# Patient Record
Sex: Female | Born: 1990 | Race: White | Hispanic: Yes | Marital: Single | Smoking: Never smoker
Health system: Southern US, Community
[De-identification: ages and names within clinical notes are randomized; demographics above are authoritative.]

## PROBLEM LIST (undated history)

## (undated) DIAGNOSIS — N939 Abnormal uterine and vaginal bleeding, unspecified: Secondary | ICD-10-CM

## (undated) DIAGNOSIS — G43E09 Chronic migraine with aura, not intractable, without status migrainosus: Secondary | ICD-10-CM

## (undated) HISTORY — PX: OTHER SURGICAL HISTORY: SHX169

## (undated) HISTORY — PX: LAPAROSCOPIC SALPINGO OOPHERECTOMY: SHX5927

---

## 2015-07-08 HISTORY — PX: LAPAROSCOPIC SALPINGO OOPHERECTOMY: SHX5927

## 2020-05-24 DIAGNOSIS — Z124 Encounter for screening for malignant neoplasm of cervix: Secondary | ICD-10-CM | POA: Diagnosis not present

## 2020-05-24 DIAGNOSIS — Z01419 Encounter for gynecological examination (general) (routine) without abnormal findings: Secondary | ICD-10-CM | POA: Diagnosis not present

## 2020-06-14 DIAGNOSIS — Z30430 Encounter for insertion of intrauterine contraceptive device: Secondary | ICD-10-CM | POA: Diagnosis not present

## 2020-06-14 DIAGNOSIS — Z3202 Encounter for pregnancy test, result negative: Secondary | ICD-10-CM | POA: Diagnosis not present

## 2020-08-17 ENCOUNTER — Other Ambulatory Visit: Payer: Self-pay | Admitting: Obstetrics

## 2020-08-17 DIAGNOSIS — N939 Abnormal uterine and vaginal bleeding, unspecified: Secondary | ICD-10-CM

## 2020-08-30 ENCOUNTER — Ambulatory Visit
Admission: RE | Admit: 2020-08-30 | Discharge: 2020-08-30 | Disposition: A | Discharge status: Home / Self Care | Payer: 59 | Source: Ambulatory Visit | Attending: Obstetrics | Admitting: Obstetrics

## 2020-08-30 ENCOUNTER — Other Ambulatory Visit: Payer: Self-pay

## 2020-08-30 DIAGNOSIS — N939 Abnormal uterine and vaginal bleeding, unspecified: Secondary | ICD-10-CM

## 2020-08-31 DIAGNOSIS — N939 Abnormal uterine and vaginal bleeding, unspecified: Secondary | ICD-10-CM | POA: Diagnosis not present

## 2021-07-23 DIAGNOSIS — L638 Other alopecia areata: Secondary | ICD-10-CM | POA: Diagnosis not present

## 2021-09-18 DIAGNOSIS — L638 Other alopecia areata: Secondary | ICD-10-CM | POA: Diagnosis not present

## 2021-10-07 DIAGNOSIS — Z01419 Encounter for gynecological examination (general) (routine) without abnormal findings: Secondary | ICD-10-CM | POA: Diagnosis not present

## 2021-10-10 LAB — COMPREHENSIVE METABOLIC PANEL
Albumin: 5 (ref 3.5–5.0)
Globulin: 3.2

## 2021-10-10 LAB — BASIC METABOLIC PANEL
BUN: 16 (ref 4–21)
Creatinine: 1.1 (ref 0.5–1.1)

## 2021-10-10 LAB — HEPATIC FUNCTION PANEL
ALT: 42 U/L — AB (ref 7–35)
AST: 28 (ref 13–35)
Alkaline Phosphatase: 75 (ref 25–125)
Bilirubin, Total: 0.4

## 2021-10-10 LAB — LIPID PANEL
Cholesterol: 192 (ref 0–200)
HDL: 56 (ref 35–70)
LDL Cholesterol: 113
Triglycerides: 112 (ref 40–160)

## 2021-10-10 LAB — HM HIV SCREENING LAB: HM HIV Screening: NEGATIVE

## 2021-10-10 LAB — HEMOGLOBIN A1C: Hemoglobin A1C: 5.7

## 2021-11-18 ENCOUNTER — Ambulatory Visit: Payer: 59 | Admitting: Medical-Surgical

## 2021-11-27 ENCOUNTER — Encounter: Payer: Self-pay | Admitting: Medical-Surgical

## 2021-11-27 ENCOUNTER — Ambulatory Visit: Payer: 59 | Admitting: Medical-Surgical

## 2021-11-27 VITALS — BP 134/86 | HR 98 | Resp 20 | Ht 64.0 in | Wt 261.6 lb

## 2021-11-27 DIAGNOSIS — Z7689 Persons encountering health services in other specified circumstances: Secondary | ICD-10-CM

## 2021-11-27 DIAGNOSIS — L639 Alopecia areata, unspecified: Secondary | ICD-10-CM | POA: Diagnosis not present

## 2021-11-27 DIAGNOSIS — G43109 Migraine with aura, not intractable, without status migrainosus: Secondary | ICD-10-CM | POA: Diagnosis not present

## 2021-11-27 DIAGNOSIS — L638 Other alopecia areata: Secondary | ICD-10-CM | POA: Diagnosis not present

## 2021-11-27 DIAGNOSIS — R7303 Prediabetes: Secondary | ICD-10-CM

## 2021-11-27 DIAGNOSIS — R635 Abnormal weight gain: Secondary | ICD-10-CM | POA: Insufficient documentation

## 2021-11-27 MED ORDER — NURTEC 75 MG PO TBDP: 75.0000 mg | ORAL_TABLET | Freq: Every day | ORAL | 1 refills | Status: DC | PRN

## 2021-11-27 MED ORDER — TOPIRAMATE 25 MG PO TABS
25.0000 mg | ORAL_TABLET | Freq: Two times a day (BID) | ORAL | 0 refills | Status: DC
  Filled 2022-01-01: qty 120, 60d supply, fill #0

## 2021-11-27 MED ORDER — SEMAGLUTIDE-WEIGHT MANAGEMENT 0.25 MG/0.5ML ~~LOC~~ SOAJ
0.2500 mg | SUBCUTANEOUS | 0 refills | Status: DC
  Filled 2022-01-01: qty 2, 28d supply, fill #0

## 2021-11-27 NOTE — Telephone Encounter (Signed)
Placed in basket to abstract.  

## 2021-11-27 NOTE — Assessment & Plan Note (Signed)
Discussed various options for weight loss treatment.  After review of medications, she would still like to proceed with GLP-1.  Sending in Wegovy 0.25 mg weekly x4 weeks to see if insurance will cover this.  We are starting Topamax which will likely help with appetite suppression and cravings as well.  Continue regular intentional exercise.  Discussed weighing and logging foods to get an accurate idea of her intake on a daily basis and help guide future dietary modifications.

## 2021-11-27 NOTE — Assessment & Plan Note (Signed)
Recommend continuing regular intentional exercise with a goal for weight loss.  Monitor dietary intake and try to reduce simple carbohydrates and concentrated sweets.  Starting SVXBLT.

## 2021-11-27 NOTE — Assessment & Plan Note (Signed)
Continue triamcinolone as directed by dermatology.

## 2021-11-27 NOTE — Assessment & Plan Note (Signed)
Start Topamax 25 mg daily for 1 week then increase to 50 mg daily.  Adding Nurtec 75 mg daily as needed as an abortive medication.

## 2021-11-27 NOTE — Progress Notes (Signed)
New Patient Office Visit  Subjective    Patient ID: Jamie Carey, female    DOB: 1991-07-03  Age: 31 y.o. MRN: [id number]  CC:  Chief Complaint  Patient presents with   Establish Care    HPI Jamie Carey presents to establish care.  Alopecia areata- managed by dermatology.  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 Treated with Sumitriptan and did fine for a while but has developed a severe allergy to triptans. Recently has become far more frequent and occurring a few times per week. Experiences photophobia, phonophobia, nausea, vomiting and generalized head pain. Auras before migraines starting several hours to minutes before and tends to be visual floaters and spots. Headaches last most of the day. Better with sleep. Has tried OTC medications however these were not helpful.   Weight concerns: eats a very healthy diet overall. Used to be very active and fit but doesn't do as much activity. Used to dance hours a day but now is walking around 3 miles per day. Does focused workouts virtually, some HIIT, some strength training.  Has been considering medications to help with weight loss and done some research into GLP-1's.  She is interested in an seeing if insurance will cover a medication to help.  She did have recent blood work including a hemoglobin A1c that was 5.7%.  She has been able to pull these up on the [facility] database and will screenshot them and send them as a message so we can abstract them into our system.  Outpatient Encounter Medications as of 11/27/2021  Medication Sig   ethynodiol-ethinyl estradiol (ZOVIA) 1-50 MG-MCG tablet Take 1 tablet by mouth daily.   Rimegepant Sulfate (NURTEC) 75 MG TBDP Take 75 mg by mouth daily as needed.   Semaglutide-Weight Management 0.25 MG/0.5ML SOAJ Inject 0.25 mg into the skin once a week.   topiramate (TOPAMAX) 25 MG tablet Take 1 tablet (25 mg total) by mouth 2 (two) times daily.   triamcinolone ointment  (KENALOG) 0.1 % Apply topically.   No facility-administered encounter medications on file as of 11/27/2021.    History reviewed. No pertinent past medical history.  Past Surgical History:  Procedure Laterality Date   saplingval oophrectomy      Family History  Problem Relation Age of Onset   Hypertension Mother    Liver cancer Father     Social History   Socioeconomic History   Marital status: Single    Spouse name: Not on file   Number of children: Not on file   Years of education: Not on file   Highest education level: Not on file  Occupational History   Not on file  Tobacco Use   Smoking status: Never   Smokeless tobacco: Never  Substance and Sexual Activity   Alcohol use: Never   Drug use: Never   Sexual activity: Never  Other Topics Concern   Not on file  Social History Narrative   Not on file   Social Determinants of Health   Financial Resource Strain: Not on file  Food Insecurity: Not on file  Transportation Needs: Not on file  Physical Activity: Not on file  Stress: Not on file  Social Connections: Not on file  Intimate Partner Violence: Not on file    Review of Systems  Constitutional:  Negative for chills, fever and malaise/fatigue.  Respiratory:  Negative for cough, shortness of breath and wheezing.   Cardiovascular:  Negative for chest pain, palpitations and leg swelling.  Neurological:  Negative for dizziness and headaches.  Psychiatric/Behavioral:  Negative for depression and suicidal ideas. The patient is not nervous/anxious and does not have insomnia.        Objective    BP 134/86 (BP Location: Right Arm, Cuff Size: Normal)   Pulse 98   Resp 20   Ht 5\' 4"  (1.626 m)   Wt 261 lb 9.6 oz (118.7 kg)   LMP 11/20/2021   SpO2 99%   BMI 44.90 kg/m   Physical Exam Vitals reviewed.  Constitutional:      General: She is not in acute distress.    Appearance: Normal appearance. She is obese. She is not ill-appearing.  HENT:     Head:  Normocephalic and atraumatic.  Cardiovascular:     Rate and Rhythm: Normal rate and regular rhythm.     Pulses: Normal pulses.     Heart sounds: Normal heart sounds. No murmur heard.   No friction rub. No gallop.  Pulmonary:     Effort: Pulmonary effort is normal. No respiratory distress.     Breath sounds: Normal breath sounds. No wheezing.  Skin:    General: Skin is warm and dry.  Neurological:     Mental Status: She is alert and oriented to person, place, and time.  Psychiatric:        Mood and Affect: Mood normal.        Behavior: Behavior normal.        Thought Content: Thought content normal.        Judgment: Judgment normal.        Assessment & Plan:   Problem List Items Addressed This Visit       Cardiovascular and Mediastinum   Migraine with aura and without status migrainosus, not intractable    Start Topamax 25 mg daily for 1 week then increase to 50 mg daily.  Adding Nurtec 75 mg daily as needed as an abortive medication.       Relevant Medications   topiramate (TOPAMAX) 25 MG tablet   Rimegepant Sulfate (NURTEC) 75 MG TBDP     Musculoskeletal and Integument   Alopecia areata    Continue triamcinolone as directed by dermatology.         Other   Morbid obesity (HCC)    Discussed various options for weight loss treatment.  After review of medications, she would still like to proceed with GLP-1.  Sending in Wegovy 0.25 mg weekly x4 weeks to see if insurance will cover this.  We are starting Topamax which will likely help with appetite suppression and cravings as well.  Continue regular intentional exercise.  Discussed weighing and logging foods to get an accurate idea of her intake on a daily basis and help guide future dietary modifications.       Relevant Medications   Semaglutide-Weight Management 0.25 MG/0.5ML SOAJ   Prediabetes    Recommend continuing regular intentional exercise with a goal for weight loss.  Monitor dietary intake and try to reduce  simple carbohydrates and concentrated sweets.  Starting 11/22/2021.       Relevant Medications   Semaglutide-Weight Management 0.25 MG/0.5ML SOAJ   Other Visit Diagnoses     Encounter to establish care    -  Primary       Return in about 6 weeks (around 01/08/2022) for migraine/weight gain follow up.   03/11/2022, NP

## 2021-11-29 ENCOUNTER — Telehealth: Payer: Self-pay | Admitting: Neurology

## 2021-11-29 ENCOUNTER — Telehealth: Payer: Self-pay

## 2021-11-29 NOTE — Telephone Encounter (Signed)
Initiated Prior authorization XBJ:YNWGNF 0.25MG/0.5ML auto-injectors Via: Covermymeds Case/Key:[id number] Status: approved as of 11/29/21 Reason:The authorization is effective for a maximum of 6 fills from 12/02/2021 to 06/21/2022, as long as the member is enrolled in their current health plan. The request was approved as submitted. This request has been approved for 56m per 28 days.Additional authorizations have been created for the following:Wegovy 0.538m0.5mL allowing 56m79mer 28 days; please reference authorization [id number].[id number] 1mg37m5mL allowing 56mL 56m 28 days; please reference authorization [id number] 1.7mg/070mmL allowing 3mL pe38m8 day; please reference authorization [id number] 2.4mg/0.746m allowing 3mL per 58mdays Notified Pt via: Mychart   Initiated Prior authorization for:NurteHQI:ONGEXBpersible tablets Via: Covermymeds Case/Key:[id number] Status: denied as of 11/29/21 Reason:uest has not been approved. Based on the information submitted for review, you did not meet our guideline rules for the requested drug. In order for your request to be approved, your provider would need to show that you have met the guideline rules below. The details below are written in medical language. If you have questions, please contact your provider. In some cases, the requested medication or alternatives offered may have additional approval requirements. Your provider requested Nurtec 75mg ODT 49mlly disintegrating tablets; medications that dissolve in your mouth) to manage your condition of episodic migraine (you experience 1-14 headache days per month). For the preventive treatment of episodic migraines, our guideline named RIMEGEPANT (the generic name for Nurtec 75mg ODT) 756mires that you will not use Nurtec 75mg ODT co28mrently (at the same time) with other CGRP inhibitors (such as Ajovy, Aimovig, Emgality, Vyepti, or Qulipta) for migraine prevention. This request was denied because we did not receive  information that you meet the requirement listed above. Notified Pt via: Mychart

## 2021-11-29 NOTE — Telephone Encounter (Signed)
Received note from [facility] that Jamie Carey is on back order. Called patient to see if she would like [place] to send to a different pharmacy. They told her they may get some on 12/06/2021. She wants to wait and see if they get this in, if not she will call with an alternative pharmacy.

## 2021-12-16 ENCOUNTER — Encounter: Payer: Self-pay | Admitting: Medical-Surgical

## 2021-12-16 DIAGNOSIS — G43109 Migraine with aura, not intractable, without status migrainosus: Secondary | ICD-10-CM

## 2021-12-18 ENCOUNTER — Telehealth: Payer: Self-pay

## 2021-12-18 NOTE — Telephone Encounter (Addendum)
Initiated Prior authorization for: Nurtec 75mg tab  Via: Covermymeds Case/Key:[id number]  Status: approved  as of 12/18/21 Reason: Notified Pt via: Mychart

## 2021-12-20 MED ORDER — NURTEC 75 MG PO TBDP
75.0000 mg | ORAL_TABLET | Freq: Every day | ORAL | 0 refills | Status: DC | PRN
  Filled 2022-02-18: qty 10, 30d supply, fill #0

## 2021-12-24 ENCOUNTER — Other Ambulatory Visit: Payer: Self-pay | Admitting: Medical-Surgical

## 2021-12-24 DIAGNOSIS — G43109 Migraine with aura, not intractable, without status migrainosus: Secondary | ICD-10-CM

## 2022-01-01 ENCOUNTER — Other Ambulatory Visit (HOSPITAL_COMMUNITY): Payer: Self-pay

## 2022-01-01 MED ORDER — CLOBETASOL PROPIONATE 0.05 % EX SOLN: CUTANEOUS | 1 refills | Status: DC

## 2022-01-08 ENCOUNTER — Other Ambulatory Visit (HOSPITAL_COMMUNITY): Payer: Self-pay

## 2022-01-10 ENCOUNTER — Ambulatory Visit: Payer: 59 | Admitting: Medical-Surgical

## 2022-02-04 ENCOUNTER — Encounter: Payer: Self-pay | Admitting: Medical-Surgical

## 2022-02-17 NOTE — Progress Notes (Unsigned)
   Established Patient Office Visit  Subjective   Patient ID: Jamie Carey, female   DOB: Jun 11, 1991 Age: 31 y.o. MRN: [id number]   No chief complaint on file.   HPI Pleasant 31 year old female presenting today for the following:  Weight gain:  Prediabetes:  Migraines:   Objective:    There were no vitals filed for this visit.  Physical Exam   No results found for this or any previous visit (from the past 24 hour(s)).   {Labs (Optional):23779}  The ASCVD Risk score (Arnett DK, et al., 2019) failed to calculate for the following reasons:   The 2019 ASCVD risk score is only valid for ages 42 to 28   Assessment & Plan:   No problem-specific Assessment & Plan notes found for this encounter.   No follow-ups on file.  ___________________________________________ Thayer Ohm, DNP, APRN, FNP-BC Primary Care and Sports Medicine [facility] [place]

## 2022-02-18 ENCOUNTER — Ambulatory Visit: Payer: 59 | Admitting: Medical-Surgical

## 2022-02-18 ENCOUNTER — Other Ambulatory Visit (HOSPITAL_COMMUNITY): Payer: Self-pay

## 2022-02-18 ENCOUNTER — Encounter: Payer: Self-pay | Admitting: Medical-Surgical

## 2022-02-18 VITALS — BP 135/85 | HR 89 | Resp 20 | Ht 64.0 in | Wt 256.4 lb

## 2022-02-18 DIAGNOSIS — R635 Abnormal weight gain: Secondary | ICD-10-CM | POA: Diagnosis not present

## 2022-02-18 DIAGNOSIS — G43109 Migraine with aura, not intractable, without status migrainosus: Secondary | ICD-10-CM | POA: Diagnosis not present

## 2022-02-18 DIAGNOSIS — R7303 Prediabetes: Secondary | ICD-10-CM

## 2022-02-18 MED ORDER — SEMAGLUTIDE-WEIGHT MANAGEMENT 1.7 MG/0.75ML ~~LOC~~ SOAJ
1.7000 mg | SUBCUTANEOUS | 3 refills | Status: DC
  Filled 2022-02-18: qty 3, fill #0
  Filled 2022-04-20: qty 3, 28d supply, fill #0
  Filled 2022-05-24: qty 3, 28d supply, fill #1

## 2022-02-18 MED ORDER — TOPIRAMATE 50 MG PO TABS
50.0000 mg | ORAL_TABLET | Freq: Two times a day (BID) | ORAL | 1 refills | Status: DC
  Filled 2022-02-18: qty 180, 90d supply, fill #0
  Filled 2022-05-24: qty 180, 90d supply, fill #1

## 2022-02-18 MED ORDER — SEMAGLUTIDE-WEIGHT MANAGEMENT 0.5 MG/0.5ML ~~LOC~~ SOAJ
0.5000 mg | SUBCUTANEOUS | 0 refills | Status: AC
  Filled 2022-02-18: qty 2, 28d supply, fill #0

## 2022-02-18 MED ORDER — SEMAGLUTIDE-WEIGHT MANAGEMENT 1 MG/0.5ML ~~LOC~~ SOAJ
1.0000 mg | SUBCUTANEOUS | 0 refills | Status: AC
  Filled 2022-02-18 – 2022-03-18 (×2): qty 2, 28d supply, fill #0

## 2022-02-19 ENCOUNTER — Other Ambulatory Visit (HOSPITAL_COMMUNITY): Payer: Self-pay

## 2022-02-27 ENCOUNTER — Other Ambulatory Visit (HOSPITAL_COMMUNITY): Payer: Self-pay

## 2022-03-18 ENCOUNTER — Other Ambulatory Visit (HOSPITAL_COMMUNITY): Payer: Self-pay

## 2022-03-19 ENCOUNTER — Other Ambulatory Visit (HOSPITAL_COMMUNITY): Payer: Self-pay

## 2022-03-31 ENCOUNTER — Other Ambulatory Visit: Payer: Self-pay | Admitting: Medical-Surgical

## 2022-03-31 ENCOUNTER — Other Ambulatory Visit (HOSPITAL_COMMUNITY): Payer: Self-pay

## 2022-03-31 DIAGNOSIS — G43109 Migraine with aura, not intractable, without status migrainosus: Secondary | ICD-10-CM

## 2022-03-31 MED ORDER — NURTEC 75 MG PO TBDP
75.0000 mg | ORAL_TABLET | Freq: Every day | ORAL | 0 refills | Status: DC | PRN
  Filled 2022-03-31: qty 10, 30d supply, fill #0

## 2022-03-31 NOTE — Telephone Encounter (Signed)
Last office visit 02/18/2022  Last filled 12/24/2021

## 2022-04-01 ENCOUNTER — Other Ambulatory Visit (HOSPITAL_COMMUNITY): Payer: Self-pay

## 2022-04-21 ENCOUNTER — Other Ambulatory Visit (HOSPITAL_COMMUNITY): Payer: Self-pay

## 2022-04-22 ENCOUNTER — Other Ambulatory Visit (HOSPITAL_COMMUNITY): Payer: Self-pay

## 2022-04-28 ENCOUNTER — Other Ambulatory Visit (HOSPITAL_COMMUNITY): Payer: Self-pay

## 2022-05-01 ENCOUNTER — Other Ambulatory Visit (HOSPITAL_COMMUNITY): Payer: Self-pay

## 2022-05-21 ENCOUNTER — Ambulatory Visit: Payer: 59 | Admitting: Medical-Surgical

## 2022-05-24 ENCOUNTER — Other Ambulatory Visit (HOSPITAL_COMMUNITY): Payer: Self-pay

## 2022-05-26 ENCOUNTER — Other Ambulatory Visit (HOSPITAL_COMMUNITY): Payer: Self-pay

## 2022-06-12 ENCOUNTER — Ambulatory Visit: Payer: 59 | Admitting: Medical-Surgical

## 2022-06-12 ENCOUNTER — Other Ambulatory Visit (HOSPITAL_COMMUNITY): Payer: Self-pay

## 2022-06-12 ENCOUNTER — Telehealth: Payer: Self-pay

## 2022-06-12 ENCOUNTER — Encounter: Payer: Self-pay | Admitting: Medical-Surgical

## 2022-06-12 VITALS — BP 126/83 | HR 81 | Resp 20 | Ht 64.0 in | Wt 231.5 lb

## 2022-06-12 DIAGNOSIS — R635 Abnormal weight gain: Secondary | ICD-10-CM

## 2022-06-12 MED ORDER — ONDANSETRON 4 MG PO TBDP
4.0000 mg | ORAL_TABLET | Freq: Three times a day (TID) | ORAL | 3 refills | Status: DC | PRN
  Filled 2022-06-12: qty 20, 7d supply, fill #0
  Filled 2022-08-25: qty 20, 7d supply, fill #1

## 2022-06-12 MED ORDER — SEMAGLUTIDE-WEIGHT MANAGEMENT 1.7 MG/0.75ML ~~LOC~~ SOAJ
1.7000 mg | SUBCUTANEOUS | 1 refills | Status: DC
  Filled 2022-06-12 – 2022-07-03 (×3): qty 3, 28d supply, fill #0

## 2022-06-12 NOTE — Telephone Encounter (Signed)
Initiated Prior authorization YYF:RTMYTR 75MG  dispersible tablets Via: Covermymeds Case/Key:[id number]  Status: N/A as of 06/12/22 Reason:pt is not up for a renewal on this medication Member has an active PA on file which is expiring on 06/18/2022 and has 3 no. of fills remaining. Notified Pt via: Mychart

## 2022-06-12 NOTE — Progress Notes (Signed)
Established Patient Office Visit  Subjective   Patient ID: Jamie Carey, female   DOB: [birth year] Age: 31 y.o. MRN: [id number]   Chief Complaint  Patient presents with   Weight Check    HPI Pleasant 31 year old female presenting today for weight check on Wegovy.  She has been doing very well on Wegovy and is currently at the 1.7 mg dose.  She has been at this weekly dose for about 7 weeks.  Initially started out with some constipation but this has fully resolved and she has no other side effects or concerns.  Exercising regularly and admits that she does a lot of different things but tries to walk every day.  Has also incorporated strength training into the mix.  Making healthy choices and aiming for small portions.  Admits that she usually eats like a bird but she has tried to make a conscious effort to eat 3 meals per day.  Prefers not to snack.  Migraines: Taking Topamax 50 mg twice daily as prescribed.  Tolerating well without side effects.  Using Nurtec 75 mg daily as needed for breakthrough migraines.  Estimates that she previously had 3-4 migraines per week and this has reduced to 3 to 4/month.  She is very happy with this reduction and feels that the medication is at a good dose for her.   Objective:    Vitals:   06/12/22 1012  BP: 126/83  Pulse: 81  Resp: 20  Height: 5\' 4"  (1.626 m)  Weight: 231 lb 8 oz (105 kg)  SpO2: 98%  BMI (Calculated): 39.72    Physical Exam Vitals and nursing note reviewed.  Constitutional:      General: She is not in acute distress.    Appearance: Normal appearance. She is not ill-appearing.  HENT:     Head: Normocephalic and atraumatic.  Cardiovascular:     Rate and Rhythm: Normal rate and regular rhythm.  Pulmonary:     Effort: Pulmonary effort is normal. No respiratory distress.  Skin:    General: Skin is warm and dry.  Neurological:     Mental Status: She is alert and oriented to person, place, and time.  Psychiatric:         Mood and Affect: Mood normal.        Behavior: Behavior normal.        Thought Content: Thought content normal.        Judgment: Judgment normal.   No results found for this or any previous visit (from the past 24 hour(s)).     The ASCVD Risk score (Arnett DK, et al., 2019) failed to calculate for the following reasons:   The 2019 ASCVD risk score is only valid for ages 43 to 6   Assessment & Plan:   1. Weight gain Since the middle of August, she has lost 25 pounds and is doing extremely well with diet and lifestyle modifications.  Plan to continue Wegovy at 1.7 mg weekly for now since she is still having good weight loss results.  This gives [place] a place to go in the case of a plateau.  She does not currently have a goal set for her preferred weight but will think on this.  Continue regular intentional exercise and dietary changes.  2.  Migraines Continue Topamax 50 mg twice daily and Nurtec 75 mg as needed for breakthrough migraines.  Adding Zofran 4 mg every 8 hours as needed for nausea or vomiting for breakthrough migraines.  Return in  about 6 months (around 12/12/2022) for weight/migraine follow up.  ___________________________________________ Thayer Ohm, DNP, APRN, FNP-BC Primary Care and Sports Medicine [facility] [place]

## 2022-06-21 ENCOUNTER — Other Ambulatory Visit: Payer: Self-pay | Admitting: Medical-Surgical

## 2022-06-21 ENCOUNTER — Other Ambulatory Visit (HOSPITAL_COMMUNITY): Payer: Self-pay

## 2022-06-21 DIAGNOSIS — G43109 Migraine with aura, not intractable, without status migrainosus: Secondary | ICD-10-CM

## 2022-06-23 ENCOUNTER — Other Ambulatory Visit (HOSPITAL_COMMUNITY): Payer: Self-pay

## 2022-06-23 MED ORDER — NURTEC 75 MG PO TBDP
75.0000 mg | ORAL_TABLET | Freq: Every day | ORAL | 0 refills | Status: DC | PRN
  Filled 2022-06-23: qty 8, 30d supply, fill #0
  Filled 2022-07-22: qty 10, 30d supply, fill #0

## 2022-06-26 ENCOUNTER — Other Ambulatory Visit (HOSPITAL_COMMUNITY): Payer: Self-pay

## 2022-06-27 ENCOUNTER — Other Ambulatory Visit (HOSPITAL_COMMUNITY): Payer: Self-pay

## 2022-07-01 ENCOUNTER — Other Ambulatory Visit (HOSPITAL_COMMUNITY): Payer: Self-pay

## 2022-07-03 ENCOUNTER — Encounter: Payer: Self-pay | Admitting: Medical-Surgical

## 2022-07-03 ENCOUNTER — Other Ambulatory Visit (HOSPITAL_COMMUNITY): Payer: Self-pay

## 2022-07-04 ENCOUNTER — Other Ambulatory Visit (HOSPITAL_COMMUNITY): Payer: Self-pay

## 2022-07-04 MED ORDER — WEGOVY 2.4 MG/0.75ML ~~LOC~~ SOAJ
2.4000 mg | SUBCUTANEOUS | 11 refills | Status: DC
  Filled 2022-07-04 – 2022-08-02 (×3): qty 3, 28d supply, fill #0
  Filled 2022-08-25: qty 3, 28d supply, fill #1
  Filled 2022-09-22 – 2022-09-24 (×2): qty 3, 28d supply, fill #2

## 2022-07-10 ENCOUNTER — Telehealth: Payer: Self-pay

## 2022-07-10 NOTE — Telephone Encounter (Addendum)
Initiated Prior authorization IEP:PIRJJO 2.4MG /0.75ML auto-injectors Via: Covermymeds Case/Key:[id number] Status: approved  as of 07/10/21 Reason:The request has been approved. The authorization is effective from 07/14/2022 to 07/14/2023, as long as the member is enrolled in their current health plan. The request was approved as submitted. This request has been approved for 42mL per 28 days.A second prior authorization [id number]) has been entered for [facility] 0.25mg /0.81mL, this request is effective from 07/14/2022 and is valid until 07/14/2023 and is limited to 21mL per 28 days.A third prior authorization [id number]) has been entered for [facility] 0.5mg /0.67mL, this request is effective from 07/14/2022 and is valid until 07/14/2023 and is limited to 52mL per 28 days.A fourth prior authorization [id number]) has been entered for [facility] 1mg /0.56mL, this request is effective from 07/14/2022 and is valid until 07/14/2023 and is limited to 81mL per 28 days.A fifth prior authorization [id number]) has been entered for [facility] 1.7mg /0.49mL, this request is effective from 07/14/2022 and is valid until 07/14/2023 and is limited to 38mL per 28 days.Please note: This medication must be filled at and/or managed by a [place]. Please call [phone] for assistance. A written notification letter will follow with additional details. Notified Pt via: Mychart

## 2022-07-22 ENCOUNTER — Other Ambulatory Visit (HOSPITAL_COMMUNITY): Payer: Self-pay

## 2022-07-22 NOTE — Telephone Encounter (Signed)
Initiated Prior authorization ANV:BTYOMA 75MG  dispersible tablets  Via: Covermymeds Case/Key:[id number] Status: approved  as of 07/22/21 Reason:The authorization is effective from 07/22/2022 to 01/19/2023 Notified Pt via: Mychart

## 2022-07-23 ENCOUNTER — Other Ambulatory Visit (HOSPITAL_COMMUNITY): Payer: Self-pay

## 2022-07-24 ENCOUNTER — Other Ambulatory Visit (HOSPITAL_COMMUNITY): Payer: Self-pay

## 2022-07-28 ENCOUNTER — Other Ambulatory Visit (HOSPITAL_COMMUNITY): Payer: Self-pay

## 2022-08-02 ENCOUNTER — Other Ambulatory Visit (HOSPITAL_COMMUNITY): Payer: Self-pay

## 2022-08-25 ENCOUNTER — Other Ambulatory Visit: Payer: Self-pay | Admitting: Medical-Surgical

## 2022-08-25 ENCOUNTER — Other Ambulatory Visit (HOSPITAL_COMMUNITY): Payer: Self-pay

## 2022-08-25 ENCOUNTER — Other Ambulatory Visit: Payer: Self-pay

## 2022-08-25 DIAGNOSIS — G43109 Migraine with aura, not intractable, without status migrainosus: Secondary | ICD-10-CM

## 2022-08-25 MED ORDER — TOPIRAMATE 50 MG PO TABS
50.0000 mg | ORAL_TABLET | Freq: Two times a day (BID) | ORAL | 1 refills | Status: DC
  Filled 2022-08-25 – 2022-12-22 (×4): qty 180, 90d supply, fill #0
  Filled 2023-02-07 – 2023-05-24 (×2): qty 180, 90d supply, fill #1

## 2022-09-04 ENCOUNTER — Other Ambulatory Visit (HOSPITAL_COMMUNITY): Payer: Self-pay

## 2022-09-23 ENCOUNTER — Other Ambulatory Visit: Payer: Self-pay

## 2022-09-23 ENCOUNTER — Other Ambulatory Visit (HOSPITAL_COMMUNITY): Payer: Self-pay

## 2022-09-24 ENCOUNTER — Other Ambulatory Visit (HOSPITAL_COMMUNITY): Payer: Self-pay

## 2022-09-24 ENCOUNTER — Other Ambulatory Visit: Payer: Self-pay

## 2022-10-03 ENCOUNTER — Other Ambulatory Visit (HOSPITAL_COMMUNITY): Payer: Self-pay

## 2022-11-10 ENCOUNTER — Ambulatory Visit: Payer: 59 | Admitting: Medical-Surgical

## 2022-12-11 ENCOUNTER — Ambulatory Visit: Payer: 59 | Admitting: Medical-Surgical

## 2022-12-11 ENCOUNTER — Encounter: Payer: Self-pay | Admitting: Medical-Surgical

## 2022-12-11 ENCOUNTER — Other Ambulatory Visit: Payer: Self-pay

## 2022-12-11 ENCOUNTER — Other Ambulatory Visit (HOSPITAL_COMMUNITY): Payer: Self-pay

## 2022-12-11 VITALS — BP 120/82 | HR 92 | Resp 20 | Ht 64.0 in | Wt 205.2 lb

## 2022-12-11 DIAGNOSIS — R635 Abnormal weight gain: Secondary | ICD-10-CM | POA: Diagnosis not present

## 2022-12-11 DIAGNOSIS — G43109 Migraine with aura, not intractable, without status migrainosus: Secondary | ICD-10-CM

## 2022-12-11 MED ORDER — NURTEC 75 MG PO TBDP
75.0000 mg | ORAL_TABLET | Freq: Every day | ORAL | 3 refills | Status: DC | PRN
  Filled 2022-12-11: qty 16, 30d supply, fill #0
  Filled 2023-02-07 – 2023-04-20 (×2): qty 10, 30d supply, fill #0
  Filled 2023-05-24: qty 10, 30d supply, fill #1
  Filled 2023-06-21: qty 10, 30d supply, fill #2
  Filled 2023-09-21: qty 10, 30d supply, fill #3

## 2022-12-11 MED ORDER — ONDANSETRON 4 MG PO TBDP
4.0000 mg | ORAL_TABLET | Freq: Three times a day (TID) | ORAL | 3 refills | Status: DC | PRN
  Filled 2022-12-11 – 2022-12-22 (×2): qty 20, 7d supply, fill #0
  Filled 2023-02-07: qty 20, 7d supply, fill #1
  Filled 2023-05-24: qty 20, 7d supply, fill #2
  Filled 2023-06-21: qty 20, 7d supply, fill #3

## 2022-12-11 NOTE — Progress Notes (Signed)
        Established patient visit  History, exam, impression, and plan:  1. Migraine with aura and without status migrainosus, not intractable Pleasant 32 year old female presenting today with a history of migraines.  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  She had been taking 50 mg twice daily however she did cut back to taking it once daily instead.  Has started having a few more breakthrough headaches and plans to increase her dosing to twice daily again.  Has had to use Nurtec a couple of times and reports that it works very well to help manage her symptoms.  Has Zofran to use as needed due to associated nausea.  Has no significant side effects to the medication.  Denies any changes to the quality and characteristics of her migraines.  Plan to increase Topamax to 50 mg twice daily.  Refill Nurtec for as needed use.  Refilling Zofran. - Rimegepant Sulfate (NURTEC) 75 MG TBDP; Dissolve 1 tablet on the tongue daily as needed  Dispense: 10 tablet; Refill: 3  2. Weight gain Has had significant concerns about her weight and has been making efforts for weight loss.  We started Topamax for migraines however we have also been using this to help with weight loss.  Has noticed some changes to her taste, specifically around sodas.  In the last year has lost approximately 55 pounds and is very happy with her results so far.  Has resumed exercise and is doing cardio and strength training 5 to 7 days/week.  As she has made excellent progress so far, plan to continue dietary modification and exercise.  Increasing Topamax to 50 mg twice daily as noted above which may continue to help with further weight loss.   Procedures performed this visit: None.  Return for annual physical exam at your convenience.  __________________________________ Thayer Ohm, DNP, APRN, FNP-BC Primary Care and Sports Medicine [facility] [place]

## 2022-12-13 ENCOUNTER — Other Ambulatory Visit (HOSPITAL_COMMUNITY): Payer: Self-pay

## 2022-12-19 ENCOUNTER — Other Ambulatory Visit (HOSPITAL_COMMUNITY): Payer: Self-pay

## 2022-12-22 ENCOUNTER — Other Ambulatory Visit: Payer: Self-pay

## 2022-12-23 ENCOUNTER — Other Ambulatory Visit (HOSPITAL_COMMUNITY): Payer: Self-pay

## 2022-12-24 ENCOUNTER — Other Ambulatory Visit (HOSPITAL_COMMUNITY): Payer: Self-pay

## 2023-01-05 ENCOUNTER — Other Ambulatory Visit (HOSPITAL_COMMUNITY): Payer: Self-pay

## 2023-01-19 ENCOUNTER — Other Ambulatory Visit (HOSPITAL_COMMUNITY): Payer: Self-pay

## 2023-01-28 IMAGING — US US PELVIS COMPLETE WITH TRANSVAGINAL
1 series · 13 of 25 positions shown · non-contrast
Comparison: None

CLINICAL DATA: Abnormal uterine and vaginal bleeding motor an IUD

EXAM:
TRANSABDOMINAL AND TRANSVAGINAL ULTRASOUND OF PELVIS
TECHNIQUE: Both transabdominal and transvaginal ultrasound examinations of the
pelvis were performed. Transabdominal technique was performed for
global imaging of the pelvis including uterus, ovaries, adnexal
regions, and pelvic cul-de-sac. It was necessary to proceed with
endovaginal exam following the transabdominal exam to visualize the
uterus endometrium ovaries.

[Series 1: us pelvis complete with transvaginal · 0.26mm/px · 52 acquisitions, 13 frames shown]
[im 1/52]
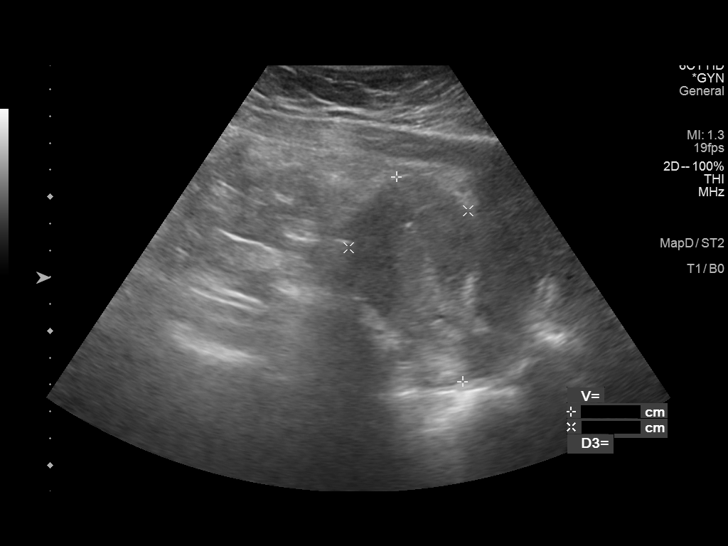
[im 5/52]
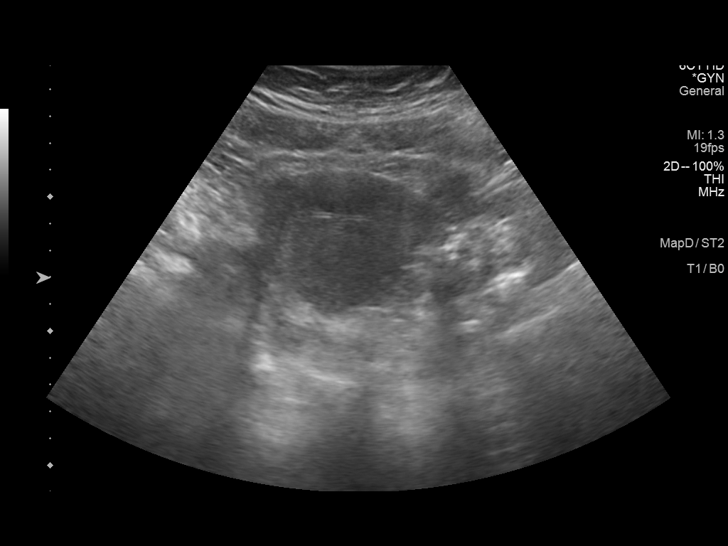
[im 9/52]
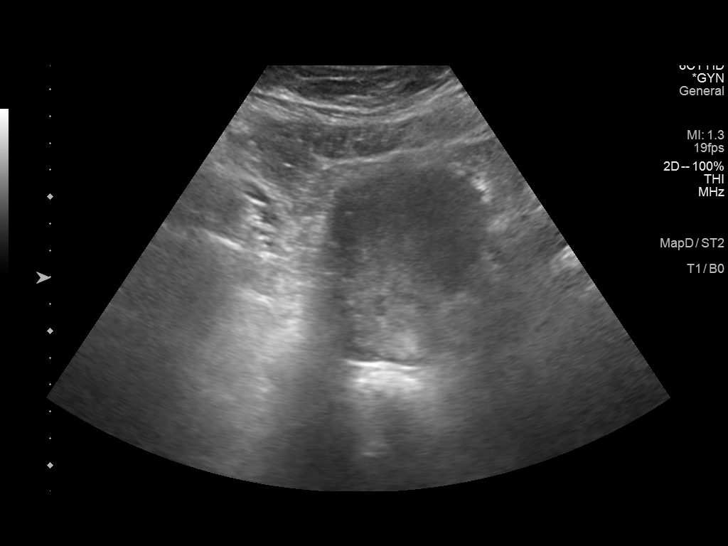
[im 13/52]
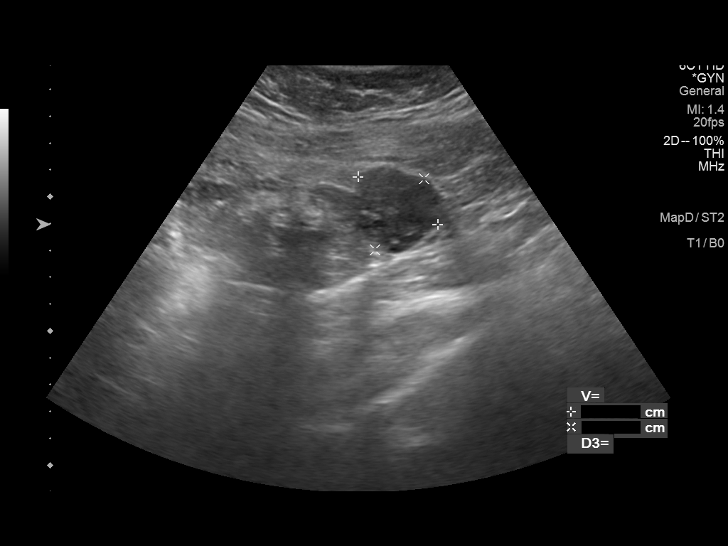
[im 18/52]
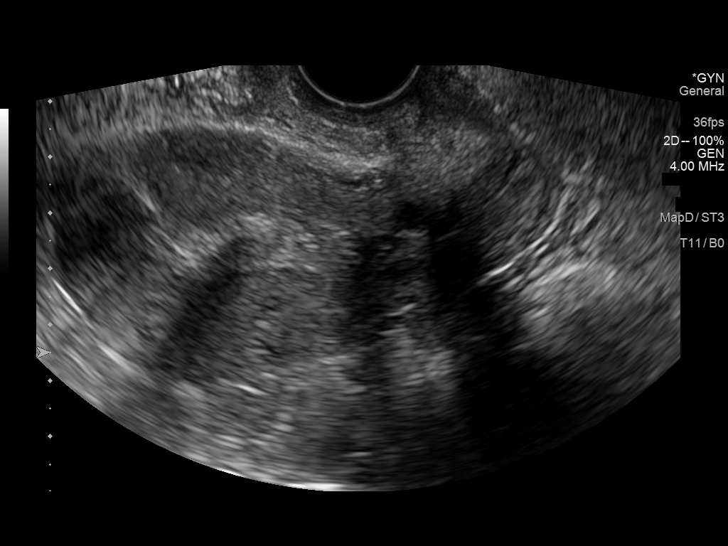
[im 22/52]
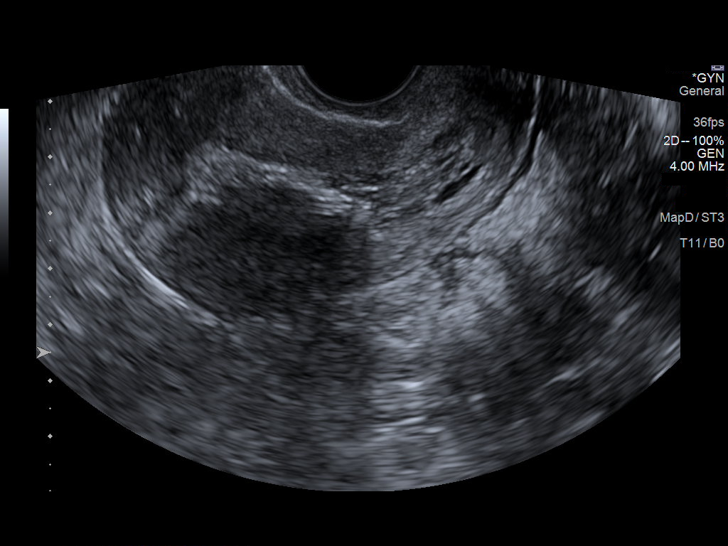
[im 26/52]
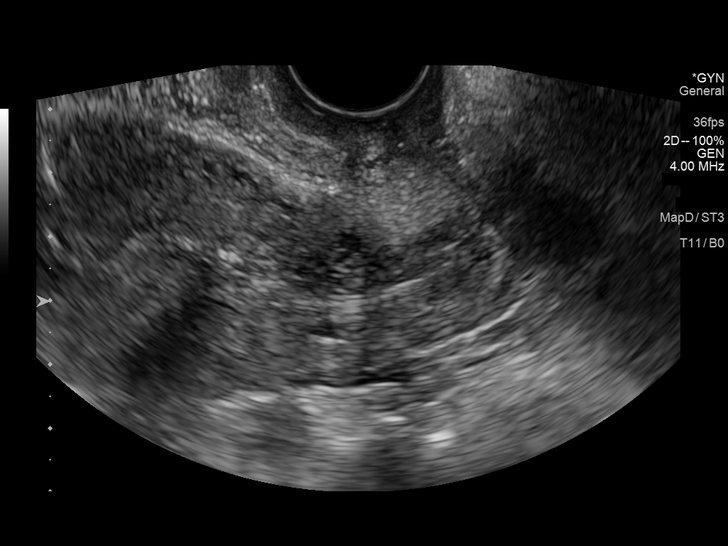
[im 30/52]
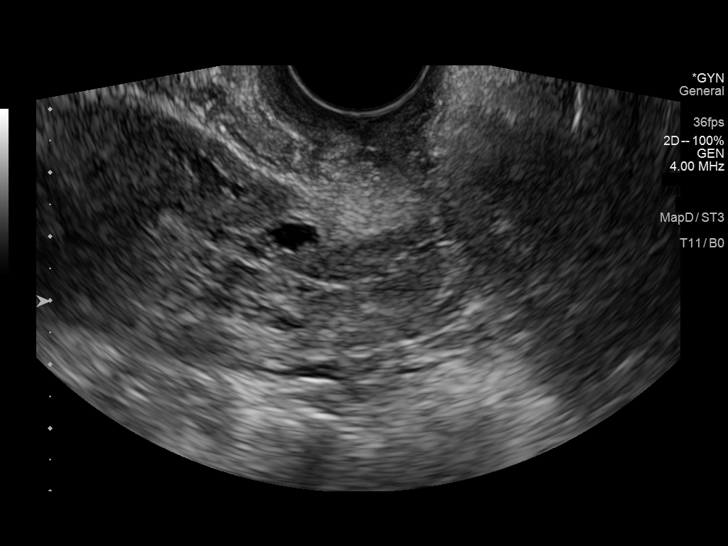
[im 35/52]
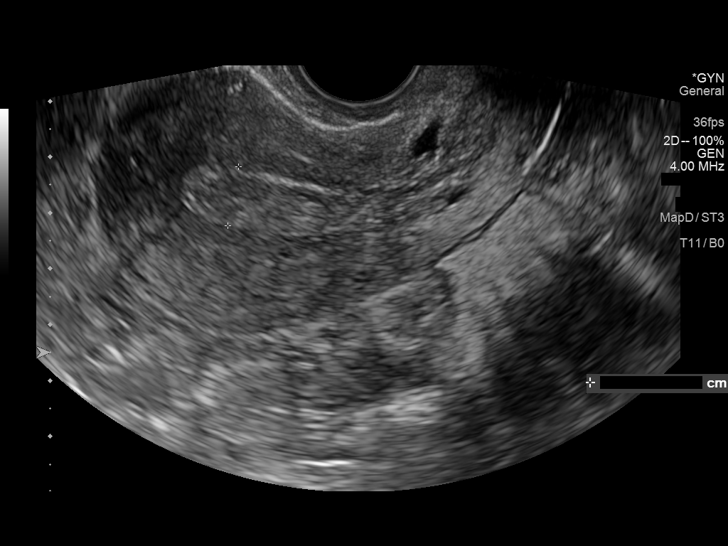
[im 39/52]
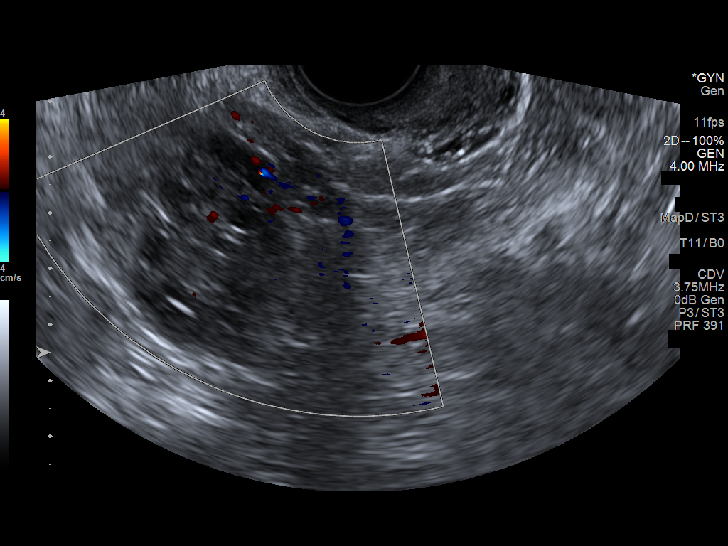
[im 43/52]
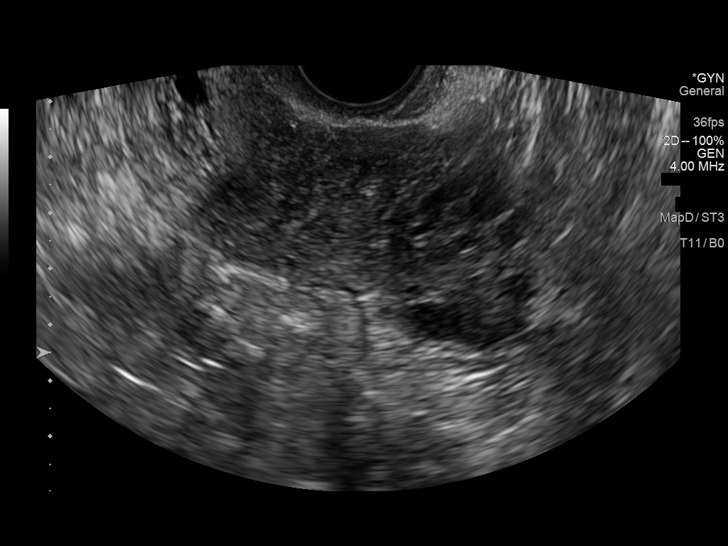
[im 47/52]
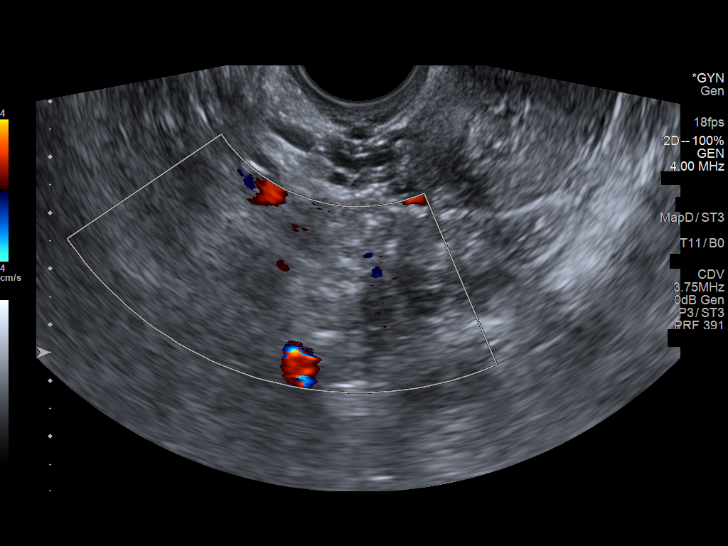
[im 52/52]
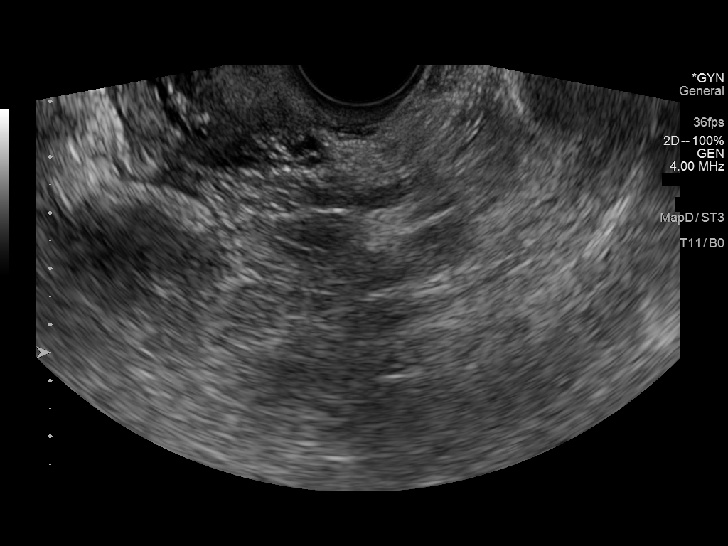

[13 of 25 positions shown; findings below may reference images not displayed]

FINDINGS: Uterus

Measurements: 8 x 4.7 x 5.8 cm = volume: 113.7 mL. Multiple
nabothian cysts in the cervix.

Endometrium

Thickness: 10.7 mm.  IUD within the uterus

Right ovary

Not seen, reported surgically absent

Left ovary

Measurements: 4.2 x 3.3 x 2.7 cm = volume: 19.2 mL. Numerous
follicles. No dominant mass.

Other findings

No abnormal free fluid.
IMPRESSION: 1. IUD within the uterus, appears grossly appropriate in position by
sonography. Normal premenopausal endometrial thickness
2. Nonvisualized right ovary consistent with history of prior
removal.
3. Mildly prominent left ovary with numerous follicles, question
polycystic ovary.

## 2023-01-29 ENCOUNTER — Other Ambulatory Visit (HOSPITAL_COMMUNITY): Payer: Self-pay

## 2023-02-09 ENCOUNTER — Other Ambulatory Visit (HOSPITAL_COMMUNITY): Payer: Self-pay

## 2023-02-09 ENCOUNTER — Other Ambulatory Visit: Payer: Self-pay

## 2023-02-09 DIAGNOSIS — N939 Abnormal uterine and vaginal bleeding, unspecified: Secondary | ICD-10-CM | POA: Diagnosis not present

## 2023-02-09 DIAGNOSIS — Z113 Encounter for screening for infections with a predominantly sexual mode of transmission: Secondary | ICD-10-CM | POA: Diagnosis not present

## 2023-02-09 DIAGNOSIS — Z124 Encounter for screening for malignant neoplasm of cervix: Secondary | ICD-10-CM | POA: Diagnosis not present

## 2023-02-09 DIAGNOSIS — Z01419 Encounter for gynecological examination (general) (routine) without abnormal findings: Secondary | ICD-10-CM | POA: Diagnosis not present

## 2023-02-12 ENCOUNTER — Encounter: Payer: Self-pay | Admitting: Medical-Surgical

## 2023-03-25 ENCOUNTER — Other Ambulatory Visit (HOSPITAL_COMMUNITY): Payer: Self-pay

## 2023-03-25 MED ORDER — NORETHINDRONE ACETATE 5 MG PO TABS
15.0000 mg | ORAL_TABLET | Freq: Every day | ORAL | 3 refills | Status: DC
  Filled 2023-03-25: qty 90, 30d supply, fill #0
  Filled 2023-04-20: qty 90, 30d supply, fill #1
  Filled 2023-05-24: qty 90, 30d supply, fill #2
  Filled 2023-06-21: qty 90, 30d supply, fill #3

## 2023-03-26 ENCOUNTER — Other Ambulatory Visit (HOSPITAL_COMMUNITY): Payer: Self-pay

## 2023-04-16 ENCOUNTER — Encounter: Payer: Self-pay | Admitting: Medical-Surgical

## 2023-04-17 ENCOUNTER — Telehealth: Payer: Self-pay

## 2023-04-17 NOTE — Telephone Encounter (Addendum)
Initiated Prior authorization ZOX:WRUEAV 75MG  dispersible tablets Via: Covermymeds Case/Key:[id number] Status: approved  as of 04/17/23 Reason:Authorization Expiration Date: 04/15/2024  Notified Pt via: Mychart

## 2023-04-20 ENCOUNTER — Other Ambulatory Visit (HOSPITAL_COMMUNITY): Payer: Self-pay

## 2023-04-22 ENCOUNTER — Other Ambulatory Visit (HOSPITAL_COMMUNITY): Payer: Self-pay

## 2023-04-29 DIAGNOSIS — M25572 Pain in left ankle and joints of left foot: Secondary | ICD-10-CM | POA: Diagnosis not present

## 2023-05-13 DIAGNOSIS — M25572 Pain in left ankle and joints of left foot: Secondary | ICD-10-CM | POA: Diagnosis not present

## 2023-06-02 DIAGNOSIS — M25572 Pain in left ankle and joints of left foot: Secondary | ICD-10-CM | POA: Diagnosis not present

## 2023-06-21 ENCOUNTER — Other Ambulatory Visit: Payer: Self-pay | Admitting: Medical-Surgical

## 2023-06-21 DIAGNOSIS — G43109 Migraine with aura, not intractable, without status migrainosus: Secondary | ICD-10-CM

## 2023-06-22 ENCOUNTER — Other Ambulatory Visit (HOSPITAL_COMMUNITY): Payer: Self-pay

## 2023-06-22 MED ORDER — TOPIRAMATE 50 MG PO TABS
50.0000 mg | ORAL_TABLET | Freq: Two times a day (BID) | ORAL | 0 refills | Status: DC
  Filled 2023-06-22 – 2023-09-21 (×2): qty 180, 90d supply, fill #0

## 2023-06-23 ENCOUNTER — Other Ambulatory Visit: Payer: Self-pay

## 2023-07-27 ENCOUNTER — Other Ambulatory Visit (HOSPITAL_COMMUNITY): Payer: Self-pay

## 2023-07-29 ENCOUNTER — Other Ambulatory Visit (HOSPITAL_COMMUNITY): Payer: Self-pay

## 2023-07-29 MED ORDER — NORETHINDRONE ACETATE 5 MG PO TABS
15.0000 mg | ORAL_TABLET | Freq: Every day | ORAL | 0 refills | Status: DC
  Filled 2023-07-29: qty 90, 30d supply, fill #0

## 2023-07-30 ENCOUNTER — Other Ambulatory Visit (HOSPITAL_COMMUNITY): Payer: Self-pay

## 2023-08-03 ENCOUNTER — Other Ambulatory Visit (HOSPITAL_COMMUNITY): Payer: Self-pay

## 2023-08-06 DIAGNOSIS — N939 Abnormal uterine and vaginal bleeding, unspecified: Secondary | ICD-10-CM | POA: Diagnosis not present

## 2023-08-22 ENCOUNTER — Other Ambulatory Visit (HOSPITAL_COMMUNITY): Payer: Self-pay

## 2023-08-24 ENCOUNTER — Other Ambulatory Visit (HOSPITAL_COMMUNITY): Payer: Self-pay

## 2023-08-24 MED ORDER — NORETHINDRONE ACETATE 5 MG PO TABS
15.0000 mg | ORAL_TABLET | Freq: Every day | ORAL | 0 refills | Status: DC
Start: 2023-08-24 — End: 2023-09-22
  Filled 2023-08-24: qty 90, 30d supply, fill #0

## 2023-09-21 ENCOUNTER — Other Ambulatory Visit: Payer: Self-pay | Admitting: Medical-Surgical

## 2023-09-21 ENCOUNTER — Other Ambulatory Visit: Payer: Self-pay

## 2023-09-21 ENCOUNTER — Other Ambulatory Visit (HOSPITAL_COMMUNITY): Payer: Self-pay

## 2023-09-22 ENCOUNTER — Other Ambulatory Visit (HOSPITAL_COMMUNITY): Payer: Self-pay

## 2023-09-22 ENCOUNTER — Other Ambulatory Visit: Payer: Self-pay

## 2023-09-22 MED ORDER — NORETHINDRONE ACETATE 5 MG PO TABS
15.0000 mg | ORAL_TABLET | Freq: Every day | ORAL | 2 refills | Status: DC
  Filled 2023-09-22: qty 90, 30d supply, fill #0
  Filled 2023-10-23: qty 90, 30d supply, fill #1
  Filled 2023-11-20: qty 90, 30d supply, fill #2

## 2023-09-23 ENCOUNTER — Other Ambulatory Visit: Payer: Self-pay

## 2023-09-23 ENCOUNTER — Other Ambulatory Visit (HOSPITAL_COMMUNITY): Payer: Self-pay

## 2023-09-23 MED ORDER — ONDANSETRON 4 MG PO TBDP
4.0000 mg | ORAL_TABLET | Freq: Three times a day (TID) | ORAL | 0 refills | Status: DC | PRN
  Filled 2023-09-23: qty 20, 7d supply, fill #0

## 2023-09-28 ENCOUNTER — Other Ambulatory Visit (HOSPITAL_COMMUNITY): Payer: Self-pay

## 2023-10-23 ENCOUNTER — Other Ambulatory Visit: Payer: Self-pay | Admitting: Medical-Surgical

## 2023-10-23 ENCOUNTER — Other Ambulatory Visit: Payer: Self-pay

## 2023-10-23 DIAGNOSIS — G43109 Migraine with aura, not intractable, without status migrainosus: Secondary | ICD-10-CM

## 2023-10-28 ENCOUNTER — Other Ambulatory Visit (HOSPITAL_COMMUNITY): Payer: Self-pay

## 2023-10-28 ENCOUNTER — Other Ambulatory Visit: Payer: Self-pay

## 2023-10-28 MED ORDER — TOPIRAMATE 50 MG PO TABS
50.0000 mg | ORAL_TABLET | Freq: Two times a day (BID) | ORAL | 0 refills | Status: AC
Start: 2023-10-28 — End: ?
  Filled 2023-10-28: qty 60, 30d supply, fill #0

## 2023-10-28 MED ORDER — ONDANSETRON 4 MG PO TBDP
4.0000 mg | ORAL_TABLET | Freq: Three times a day (TID) | ORAL | 0 refills | Status: AC | PRN
  Filled 2023-10-28: qty 10, 4d supply, fill #0

## 2023-10-28 MED ORDER — NURTEC 75 MG PO TBDP
75.0000 mg | ORAL_TABLET | Freq: Every day | ORAL | 0 refills | Status: AC | PRN
  Filled 2023-10-28: qty 10, 30d supply, fill #0

## 2023-11-18 ENCOUNTER — Other Ambulatory Visit (HOSPITAL_COMMUNITY): Payer: Self-pay

## 2023-11-18 MED ORDER — OXYCODONE HCL 5 MG PO TABS
5.0000 mg | ORAL_TABLET | ORAL | 0 refills | Status: DC | PRN
  Filled 2023-11-18: qty 10, 2d supply, fill #0

## 2023-11-18 MED ORDER — IBUPROFEN 600 MG PO TABS
600.0000 mg | ORAL_TABLET | ORAL | 1 refills | Status: DC
  Filled 2023-11-18: qty 60, 15d supply, fill #0

## 2023-11-20 ENCOUNTER — Encounter (HOSPITAL_COMMUNITY): Payer: Self-pay | Admitting: Obstetrics and Gynecology

## 2023-11-23 ENCOUNTER — Encounter (HOSPITAL_COMMUNITY): Payer: Self-pay | Admitting: Obstetrics and Gynecology

## 2023-11-25 ENCOUNTER — Encounter (HOSPITAL_COMMUNITY): Payer: Self-pay | Admitting: Obstetrics and Gynecology

## 2023-11-25 NOTE — Pre-Procedure Instructions (Signed)
 Surgical Instructions   Your procedure is scheduled on :  Wednesday,  12-02-2023. Report to [facility] Main Entrance "A" at 5:30  A.M., then check in with the Admitting office. Any questions or running late day of surgery: call [phone]  Questions prior to your surgery date: call [phone], Monday-Friday, 8am-4pm. If you experience any cold or flu symptoms such as cough, fever, chills, shortness of breath, etc. between now and your scheduled surgery, please notify your surgeon office.    Remember:  Do not eat any food after midnight the night before your surgery.  You may have clear liquids from midnight night before surgery until 4:30 AM.   You may drink clear liquids until 4:30 AM the morning of your surgery.   Clear liquids allowed are:  Water Carbonated Beverages Clear Tea (may sweeten, no milk, honey, etc.) Black Coffee Only (may sweeten, NO MILK, CREAM OR POWDERED CREAMER of any kind) Sport drinks, like Gatorade.  NO clear liquids after 4:30 AM  day of surgery.  This includes no water,  candy,  gum,  and  mints.    Take these medicines the morning of surgery with A SIPS OF WATER : Topiramate  (topamax ) Norethindrone  (aygestin )  May take these medicines IF NEEDED: Ondansetron  (zofran ) Rimegepant (nurtec)    One week prior to surgery, STOP taking any Aspirin (unless otherwise instructed by your surgeon) Aleve, Naproxen, Ibuprofen , Motrin , Advil , Goody's, BC's, all herbal medications, fish oil, and non-prescription vitamins.                     Do NOT Smoke (Tobacco/Vaping) and Do Not drink alcohol  for 24 hours prior to your procedure.  If you use a CPAP at night, you may bring your mask/headgear for your overnight stay.   You will be asked to remove any contacts, glasses, piercing's, hearing aid's, dentures/partials prior to surgery. Please bring cases for these items if needed.    Patients discharged the day of surgery will not be allowed to drive home, and  someone needs to stay with them for 24 hours.  SURGICAL WAITING ROOM VISITATION Patients may have no more than 2 support people in the waiting area - these visitors may rotate.   Pre-op nurse will coordinate an appropriate time for 1 ADULT support person, who may not rotate, to accompany patient in pre-op.  Children under the age of 73 must have an adult with them who is not the patient and must remain in the main waiting area with an adult.  If the patient needs to stay at the hospital during part of their recovery, the visitor guidelines for inpatient rooms apply.  Please refer to the [facility] website for the visitor guidelines for any additional information.   If you received a COVID test during your pre-op visit  it is requested that you wear a mask when out in public, stay away from anyone that may not be feeling well and notify your surgeon if you develop symptoms. If you have been in contact with anyone that has tested positive in the last 10 days please notify you surgeon.      Pre-operative CHG Bathing Instructions   You can play a key role in reducing the risk of infection after surgery. Your skin needs to be as free of germs as possible. You can reduce the number of germs on your skin by washing with CHG (chlorhexidine gluconate) soap before surgery. CHG is an antiseptic soap that kills germs and continues  to kill germs even after washing.   DO NOT use if you have an allergy to chlorhexidine/CHG or antibacterial soaps. If your skin becomes reddened or irritated, stop using the CHG and notify Pre-Op nurse day of surgery.  Please get dial soap or other antibacterial soap and shower following the instructions below.              TAKE A SHOWER THE NIGHT BEFORE SURGERY AND THE DAY OF SURGERY    Please keep in mind the following:  DO NOT shave, including legs and underarms, 48 hours prior to surgery.   You may shave your face before/day of surgery.  Place clean sheets on your  bed the night before surgery Use a clean washcloth (not used since being washed) for each shower. DO NOT sleep with pet's night before surgery.  CHG Shower Instructions:  Wash your face and private area with normal soap. If you choose to wash your hair, wash first with your normal shampoo.  After you use shampoo/soap, rinse your hair and body thoroughly to remove shampoo/soap residue.  Turn the water OFF and apply half the bottle of CHG soap to a CLEAN washcloth.  Apply CHG soap ONLY FROM YOUR NECK DOWN TO YOUR TOES (washing for 3-5 minutes)  DO NOT use CHG soap on face, private areas, open wounds, or sores.  Pay special attention to the area where your surgery is being performed.  If you are having back surgery, having someone wash your back for you may be helpful. Wait 2 minutes after CHG soap is applied, then you may rinse off the CHG soap.  Pat dry with a clean towel  Put on clean pajamas    Additional instructions for the day of surgery: DO NOT APPLY any lotions, powder,  oils,  deodorants (may use underarm deodorants)  , cologne/  perfumes  or makeup Do not wear jewelry /  piercing's/  metal/  permanent jewelry must be removed prior to arrival day of surgery.  (No plastic piercing) Do not wear nail polish, gel polish, artificial nails, or any other type of covering on natural finer nails (toe nails are okay) Do not bring valuables to the hospital. [facility] is not responsible for valuables/personal belongings. Put on clean/comfortable clothes.  Please brush your teeth.  Ask your nurse before applying any prescription medications to the skin.

## 2023-11-25 NOTE — Progress Notes (Signed)
 Spoke w/ via phone for pre-op interview--- pt Lab needs dos----   urine preg      Lab results------ lab appt 11-26-2023 @ 1300 getting CBc/ T&S COVID test -----patient states asymptomatic no test needed Arrive at -------  0530 on 12-02-2023 NPO after MN NO Solid Food.  Clear liquids from MN until--- 0430 Pre-Surgery Ensure or G2: n/a ERAS protocol:  yes  Med rec completed Medications to take morning of surgery ----- topamax , if needed take zofran / nurtec Diabetic medication ----- n/a  GLP1 agonist last dose: n/a GLP1 instructions:  Patient instructed no nail polish to be worn day of surgery Patient instructed to bring photo id and insurance card day of surgery Patient aware to have Driver (ride ) / caregiver    for 24 hours after surgery - boyfriend, Jamie Carey Patient Special Instructions ----- will pick up bag w/ soap and written instructions at lab appt Pre-Op special Instructions ----- n/a  Patient verbalized understanding of instructions that were given at this phone interview. Patient denies chest pain, sob, fever, cough at the interview.

## 2023-11-26 ENCOUNTER — Inpatient Hospital Stay (HOSPITAL_COMMUNITY): Admission: RE | Admit: 2023-11-26 | Source: Ambulatory Visit

## 2023-11-27 ENCOUNTER — Encounter (HOSPITAL_COMMUNITY)
Admission: RE | Admit: 2023-11-27 | Discharge: 2023-11-27 | Disposition: A | Discharge status: Home / Self Care | Source: Ambulatory Visit | Attending: Obstetrics and Gynecology | Admitting: Obstetrics and Gynecology

## 2023-11-27 DIAGNOSIS — Z01812 Encounter for preprocedural laboratory examination: Secondary | ICD-10-CM | POA: Insufficient documentation

## 2023-11-27 DIAGNOSIS — Z01818 Encounter for other preprocedural examination: Secondary | ICD-10-CM

## 2023-11-27 LAB — CBC
HCT: 44.8 % (ref 36.0–46.0)
Hemoglobin: 15.3 g/dL — ABNORMAL HIGH (ref 12.0–15.0)
MCH: 31.2 pg (ref 26.0–34.0)
MCHC: 34.2 g/dL (ref 30.0–36.0)
MCV: 91.4 fL (ref 80.0–100.0)
Platelets: 334 10*3/uL (ref 150–400)
RBC: 4.9 MIL/uL (ref 3.87–5.11)
RDW: 12.7 % (ref 11.5–15.5)
WBC: 6.9 10*3/uL (ref 4.0–10.5)
nRBC: 0 % (ref 0.0–0.2)

## 2023-11-27 LAB — TYPE AND SCREEN
ABO/RH(D): A POS
Antibody Screen: NEGATIVE

## 2023-12-01 NOTE — H&P (Signed)
 Jamie Carey is an 33 y.o. female G0 with abnormal uterine bleeding, has failed medical management.   Always had heavy periods since she was a teenager. Started OCPs in teen years - controlled her periods well. Until age 54 - had right oophorectomy for ovarian cyst (benign). Since that has had nonstop issues with bleeding. States she bled almost every day for 2 years straight. Was going through ultra tampons every hour at times.   In 2021 established care with Dr. Fulton Job - inserted Mirena  IUD, tried additional PO progesterone and OCPs 6 months into the Mirena  bleeding was not been as heavy, definitely had improved.  Now she is back to bleeding daily. Has used aygestin  15mg  for a few months, was initially helping then stopped working. Feels cramping/bloating/emotional. Still constant bleeding/spotting.   She ready for definitive management. Does not desire biological children. Has always been interested in fostering/adopting. Has been thinking about this for several months and ready to move forward with hysterectomy.   Still has cramping/discomfort on right side - wondering if there is any residual tissue from right oophorectomy.   Reviewed records:  08/30/20 8cm anteverted uterus, IUD in place, EMS 10.15mm, right ovary sugrically absent, left ovary with numerous follicles (PCOS?) 02/09/23 hgb 13.5  Pertinent Gynecological History: Previous GYN Procedures: right oophorectomy Last pap: normal Date: 02/09/23 OB History: G0, P0   Menstrual History: No LMP recorded. (Menstrual status: Irregular Periods).    Past Medical History:  Diagnosis Date   Abnormal uterine bleeding (AUB)    Chronic migraine with aura without status migrainosus, not intractable     Past Surgical History:  Procedure Laterality Date   LAPAROSCOPIC SALPINGO OOPHERECTOMY Right 2017    Family History  Problem Relation Age of Onset   Hypertension Mother    Liver cancer Father     Social History:  reports that she  has never smoked. She has never used smokeless tobacco. She reports current alcohol use of about 2.0 standard drinks of alcohol per week. She reports that she does not use drugs.  Allergies:  Allergies  Allergen Reactions   Imitrex [Sumatriptan] Anaphylaxis   Penicillins Rash    No medications prior to admission.    Review of Systems  Constitutional:  Negative for fever.  HENT:  Negative for sore throat.   Eyes:  Negative for visual disturbance.  Respiratory:  Negative for shortness of breath.   Cardiovascular:  Negative for chest pain.  Gastrointestinal:  Negative for abdominal pain.  Genitourinary:  Positive for menstrual problem.  Musculoskeletal:  Negative for neck pain.  Skin:  Negative for rash.  Neurological:  Negative for headaches.  Psychiatric/Behavioral:  Negative for suicidal ideas.     Height 5\' 4"  (1.626 m), weight 90.7 kg. Physical Exam Chaperone Chaperone: present  Constitutional *General Appearance:obese  Head Head: normocephalic  Lymph Nodes *Palpation: normal  Cardiovascular *Auscultation: RRR, no murmur *Peripheral Vascular: no varicosities  Lungs *Respiratory Effort: no accessory muscle usage *Auscultation: clear to auscultation Inspection: normal, normal respiratory rate  *Breast Bilateral: no skin changes, nipple appearance: normal, no abnormal nipple secretions, no tenderness, no masses palpable  Abdomen *Inspection/Palpation/Auscultation: non-distended, no tenderness, no rebound, no guarding, soft  Female Genitalia *Examination: limited by habitus Vulva: no atrophy Mons: normal, no masses Labia Majora: normal Labia Minora: normal Introitus: normal *Vagina: normal, no blood present, no erythema, no ulcers, no swelling, no masses *Cervix: grossly normal, no lesions, no cervical motion tenderness, IUD strings seen *Uterus: normal size, normal contour, mobile, non-tender *Urethral  Meatus/ Urethra: normal meatus, no  discharge *Bladder: non-distended *Adnexa/Parametria: no mass palpable, no tenderness  Extremities Legs: normal  Skin *Appearance: no rashes, no lesions  Psychiatric *Orientation: to person, to place, to time *Mood and Affect: active and alert, normal mood, normal affect  CBC    Component Value Date/Time   WBC 6.9 11/27/2023 1100   RBC 4.90 11/27/2023 1100   HGB 15.3 (H) 11/27/2023 1100   HCT 44.8 11/27/2023 1100   PLT 334 11/27/2023 1100   MCV 91.4 11/27/2023 1100   MCH 31.2 11/27/2023 1100   MCHC 34.2 11/27/2023 1100   RDW 12.7 11/27/2023 1100     Assessment/Plan: 32Y G0 with abnormal uterine bleeding, has failed medical management - Plan: robot assisted total laparoscopic hysterectomy, left salpingectomy, cystoscopy - Informed consent obtained. Reviewed risks of pain, bleeding requiring blood transfusion, infection requiring antibiotics, injury to nearby organs (bowel, bladder, nerves, blood vessels, ureter), need for laparotomy to complete the operation, or failure to achieve desired results.  - Ancef 2g  Theodoro Fisherman 12/01/2023, 7:29 PM

## 2023-12-02 ENCOUNTER — Encounter (HOSPITAL_COMMUNITY): Payer: Self-pay | Admitting: Obstetrics and Gynecology

## 2023-12-02 ENCOUNTER — Ambulatory Visit (HOSPITAL_COMMUNITY)
Admission: RE | Admit: 2023-12-02 | Discharge: 2023-12-02 | Disposition: A | Discharge status: Home / Self Care | Payer: 59 | Attending: Obstetrics and Gynecology | Admitting: Obstetrics and Gynecology

## 2023-12-02 ENCOUNTER — Ambulatory Visit (HOSPITAL_COMMUNITY): Payer: Self-pay | Admitting: Anesthesiology

## 2023-12-02 ENCOUNTER — Encounter (HOSPITAL_COMMUNITY)
Admission: RE | Disposition: A | Discharge status: Home / Self Care | Payer: Self-pay | Source: Home / Self Care | Attending: Obstetrics and Gynecology

## 2023-12-02 ENCOUNTER — Other Ambulatory Visit: Payer: Self-pay

## 2023-12-02 ENCOUNTER — Ambulatory Visit (HOSPITAL_BASED_OUTPATIENT_CLINIC_OR_DEPARTMENT_OTHER): Payer: Self-pay | Admitting: Anesthesiology

## 2023-12-02 DIAGNOSIS — N939 Abnormal uterine and vaginal bleeding, unspecified: Secondary | ICD-10-CM | POA: Insufficient documentation

## 2023-12-02 DIAGNOSIS — Z90710 Acquired absence of both cervix and uterus: Secondary | ICD-10-CM | POA: Diagnosis present

## 2023-12-02 DIAGNOSIS — E669 Obesity, unspecified: Secondary | ICD-10-CM | POA: Insufficient documentation

## 2023-12-02 DIAGNOSIS — Z6832 Body mass index (BMI) 32.0-32.9, adult: Secondary | ICD-10-CM | POA: Diagnosis not present

## 2023-12-02 DIAGNOSIS — R519 Headache, unspecified: Secondary | ICD-10-CM | POA: Insufficient documentation

## 2023-12-02 DIAGNOSIS — N736 Female pelvic peritoneal adhesions (postinfective): Secondary | ICD-10-CM | POA: Diagnosis not present

## 2023-12-02 DIAGNOSIS — Z01818 Encounter for other preprocedural examination: Secondary | ICD-10-CM

## 2023-12-02 HISTORY — PX: CYSTOSCOPY: SHX5120

## 2023-12-02 HISTORY — PX: PERINEAL LACERATION REPAIR: SHX5389

## 2023-12-02 HISTORY — DX: Abnormal uterine and vaginal bleeding, unspecified: N93.9

## 2023-12-02 HISTORY — DX: Chronic migraine with aura, not intractable, without status migrainosus: G43.E09

## 2023-12-02 HISTORY — PX: ROBOTIC ASSISTED LAPAROSCOPIC HYSTERECTOMY AND SALPINGECTOMY: SHX6379

## 2023-12-02 LAB — POCT PREGNANCY, URINE: Preg Test, Ur: NEGATIVE

## 2023-12-02 SURGERY — XI ROBOTIC ASSISTED LAPAROSCOPIC HYSTERECTOMY AND SALPINGECTOMY
Anesthesia: General | Site: Perineum | Laterality: Left

## 2023-12-02 MED ORDER — ACETAMINOPHEN 500 MG PO TABS
1000.0000 mg | ORAL_TABLET | Freq: Four times a day (QID) | ORAL | Status: DC
  Administered 2023-12-02 (×2): 1000 mg via ORAL
  Filled 2023-12-02 (×2): qty 2

## 2023-12-02 MED ORDER — CEFAZOLIN SODIUM-DEXTROSE 2-4 GM/100ML-% IV SOLN
INTRAVENOUS | Status: AC
  Filled 2023-12-02: qty 100

## 2023-12-02 MED ORDER — LIDOCAINE 2% (20 MG/ML) 5 ML SYRINGE
INTRAMUSCULAR | Status: AC
  Filled 2023-12-02: qty 5

## 2023-12-02 MED ORDER — ONDANSETRON HCL 4 MG/2ML IJ SOLN
INTRAMUSCULAR | Status: DC | PRN
  Administered 2023-12-02: 4 mg via INTRAVENOUS

## 2023-12-02 MED ORDER — ROCURONIUM BROMIDE 10 MG/ML (PF) SYRINGE
PREFILLED_SYRINGE | INTRAVENOUS | Status: AC
  Filled 2023-12-02: qty 10

## 2023-12-02 MED ORDER — ORAL CARE MOUTH RINSE: 15.0000 mL | Freq: Once | OROMUCOSAL | Status: AC

## 2023-12-02 MED ORDER — ROPIVACAINE HCL 5 MG/ML IJ SOLN
INTRAMUSCULAR | Status: AC
  Filled 2023-12-02: qty 30

## 2023-12-02 MED ORDER — SENNA 8.6 MG PO TABS
1.0000 | ORAL_TABLET | Freq: Two times a day (BID) | ORAL | Status: DC
Start: 2023-12-02 — End: 2023-12-03
  Administered 2023-12-02: 8.6 mg via ORAL
  Filled 2023-12-02: qty 1

## 2023-12-02 MED ORDER — BUPIVACAINE HCL (PF) 0.25 % IJ SOLN
INTRAMUSCULAR | Status: AC
  Filled 2023-12-02: qty 30

## 2023-12-02 MED ORDER — FLUORESCEIN SODIUM 10 % IV SOLN
INTRAVENOUS | Status: DC | PRN
  Administered 2023-12-02: 25 mg via INTRAVENOUS

## 2023-12-02 MED ORDER — MIDAZOLAM HCL 2 MG/2ML IJ SOLN
INTRAMUSCULAR | Status: AC
  Filled 2023-12-02: qty 2

## 2023-12-02 MED ORDER — FENTANYL CITRATE (PF) 100 MCG/2ML IJ SOLN
INTRAMUSCULAR | Status: AC
Start: 2023-12-02 — End: ?
  Filled 2023-12-02: qty 2

## 2023-12-02 MED ORDER — LACTATED RINGERS IV SOLN
INTRAVENOUS | Status: DC
Start: 2023-12-02 — End: 2023-12-03

## 2023-12-02 MED ORDER — ARTIFICIAL TEARS OPHTHALMIC OINT
TOPICAL_OINTMENT | OPHTHALMIC | Status: AC
Start: 2023-12-02 — End: ?
  Filled 2023-12-02: qty 3.5

## 2023-12-02 MED ORDER — MIDAZOLAM HCL 5 MG/5ML IJ SOLN
INTRAMUSCULAR | Status: DC | PRN
  Administered 2023-12-02: 2 mg via INTRAVENOUS

## 2023-12-02 MED ORDER — DEXAMETHASONE SODIUM PHOSPHATE 10 MG/ML IJ SOLN
INTRAMUSCULAR | Status: AC
Start: 2023-12-02 — End: ?
  Filled 2023-12-02: qty 1

## 2023-12-02 MED ORDER — SODIUM CHLORIDE 0.9 % IR SOLN
Status: DC | PRN
  Administered 2023-12-02: 1000 mL
  Administered 2023-12-02: 1000 mL via INTRAVESICAL

## 2023-12-02 MED ORDER — AMISULPRIDE (ANTIEMETIC) 5 MG/2ML IV SOLN: 10.0000 mg | Freq: Once | INTRAVENOUS | Status: DC | PRN

## 2023-12-02 MED ORDER — LIDOCAINE 2% (20 MG/ML) 5 ML SYRINGE
INTRAMUSCULAR | Status: DC | PRN
  Administered 2023-12-02: 80 mg via INTRAVENOUS

## 2023-12-02 MED ORDER — SIMETHICONE 80 MG PO CHEW
80.0000 mg | CHEWABLE_TABLET | Freq: Four times a day (QID) | ORAL | Status: DC | PRN
Start: 2023-12-02 — End: 2023-12-03

## 2023-12-02 MED ORDER — CHLORHEXIDINE GLUCONATE 0.12 % MT SOLN
OROMUCOSAL | Status: AC
  Filled 2023-12-02: qty 15

## 2023-12-02 MED ORDER — CHLORHEXIDINE GLUCONATE 0.12 % MT SOLN
15.0000 mL | Freq: Once | OROMUCOSAL | Status: AC
  Administered 2023-12-02: 15 mL via OROMUCOSAL

## 2023-12-02 MED ORDER — ACETAMINOPHEN 500 MG PO TABS
1000.0000 mg | ORAL_TABLET | ORAL | Status: AC
  Administered 2023-12-02: 1000 mg via ORAL

## 2023-12-02 MED ORDER — ONDANSETRON HCL 4 MG/2ML IJ SOLN: 4.0000 mg | Freq: Four times a day (QID) | INTRAMUSCULAR | Status: DC | PRN

## 2023-12-02 MED ORDER — PROPOFOL 10 MG/ML IV BOLUS
INTRAVENOUS | Status: DC | PRN
  Administered 2023-12-02: 200 mg via INTRAVENOUS

## 2023-12-02 MED ORDER — CEFAZOLIN SODIUM-DEXTROSE 2-4 GM/100ML-% IV SOLN
2.0000 g | INTRAVENOUS | Status: AC
  Administered 2023-12-02: 2 g via INTRAVENOUS

## 2023-12-02 MED ORDER — POLYETHYLENE GLYCOL 3350 17 G PO PACK: 17.0000 g | PACK | Freq: Every day | ORAL | Status: DC | PRN

## 2023-12-02 MED ORDER — SODIUM CHLORIDE 0.9 % IV SOLN
INTRAVENOUS | Status: DC | PRN
  Administered 2023-12-02: 60 mL

## 2023-12-02 MED ORDER — FLUORESCEIN SODIUM 10 % IV SOLN
INTRAVENOUS | Status: AC
  Filled 2023-12-02: qty 5

## 2023-12-02 MED ORDER — SCOPOLAMINE 1 MG/3DAYS TD PT72
MEDICATED_PATCH | TRANSDERMAL | Status: AC
  Administered 2023-12-02: 1.5 mg
  Filled 2023-12-02: qty 1

## 2023-12-02 MED ORDER — OXYCODONE HCL 5 MG/5ML PO SOLN: 5.0000 mg | Freq: Once | ORAL | Status: DC | PRN

## 2023-12-02 MED ORDER — FENTANYL CITRATE (PF) 250 MCG/5ML IJ SOLN
INTRAMUSCULAR | Status: AC
Start: 2023-12-02 — End: ?
  Filled 2023-12-02: qty 5

## 2023-12-02 MED ORDER — KETOROLAC TROMETHAMINE 30 MG/ML IJ SOLN: 30.0000 mg | Freq: Once | INTRAMUSCULAR | Status: DC | PRN

## 2023-12-02 MED ORDER — ACETAMINOPHEN 500 MG PO TABS
ORAL_TABLET | ORAL | Status: AC
  Filled 2023-12-02: qty 2

## 2023-12-02 MED ORDER — ONDANSETRON HCL 4 MG/2ML IJ SOLN
INTRAMUSCULAR | Status: AC
Start: 2023-12-02 — End: ?
  Filled 2023-12-02: qty 2

## 2023-12-02 MED ORDER — METHYLENE BLUE (ANTIDOTE) 1 % IV SOLN
INTRAVENOUS | Status: AC
  Filled 2023-12-02: qty 10

## 2023-12-02 MED ORDER — LACTATED RINGERS IV SOLN: INTRAVENOUS | Status: DC

## 2023-12-02 MED ORDER — PROPOFOL 10 MG/ML IV BOLUS
INTRAVENOUS | Status: AC
  Filled 2023-12-02: qty 20

## 2023-12-02 MED ORDER — DEXAMETHASONE SODIUM PHOSPHATE 10 MG/ML IJ SOLN
INTRAMUSCULAR | Status: DC | PRN
  Administered 2023-12-02: 10 mg via INTRAVENOUS

## 2023-12-02 MED ORDER — OXYCODONE HCL 5 MG PO TABS
5.0000 mg | ORAL_TABLET | ORAL | Status: DC | PRN
  Administered 2023-12-02 (×2): 5 mg via ORAL
  Filled 2023-12-02 (×2): qty 1

## 2023-12-02 MED ORDER — ROCURONIUM BROMIDE 10 MG/ML (PF) SYRINGE
PREFILLED_SYRINGE | INTRAVENOUS | Status: DC | PRN
  Administered 2023-12-02 (×3): 20 mg via INTRAVENOUS
  Administered 2023-12-02: 60 mg via INTRAVENOUS

## 2023-12-02 MED ORDER — OXYCODONE HCL 5 MG PO TABS: 5.0000 mg | ORAL_TABLET | Freq: Once | ORAL | Status: DC | PRN

## 2023-12-02 MED ORDER — KETOROLAC TROMETHAMINE 30 MG/ML IJ SOLN
INTRAMUSCULAR | Status: DC | PRN
  Administered 2023-12-02: 30 mg via INTRAVENOUS

## 2023-12-02 MED ORDER — HYDROMORPHONE HCL 1 MG/ML IJ SOLN
INTRAMUSCULAR | Status: AC
Start: 2023-12-02 — End: ?
  Filled 2023-12-02: qty 0.5

## 2023-12-02 MED ORDER — KETOROLAC TROMETHAMINE 30 MG/ML IJ SOLN
INTRAMUSCULAR | Status: AC
  Filled 2023-12-02: qty 1

## 2023-12-02 MED ORDER — DEXMEDETOMIDINE HCL IN NACL 80 MCG/20ML IV SOLN
INTRAVENOUS | Status: DC | PRN
  Administered 2023-12-02: 12 ug via INTRAVENOUS
  Administered 2023-12-02: 8 ug via INTRAVENOUS

## 2023-12-02 MED ORDER — SUGAMMADEX SODIUM 200 MG/2ML IV SOLN
INTRAVENOUS | Status: DC | PRN
  Administered 2023-12-02: 200 mg via INTRAVENOUS

## 2023-12-02 MED ORDER — BUPIVACAINE HCL (PF) 0.25 % IJ SOLN
INTRAMUSCULAR | Status: DC | PRN
  Administered 2023-12-02: 20 mL

## 2023-12-02 MED ORDER — IBUPROFEN 600 MG PO TABS: 600.0000 mg | ORAL_TABLET | Freq: Four times a day (QID) | ORAL | Status: DC

## 2023-12-02 MED ORDER — HYDROMORPHONE HCL 1 MG/ML IJ SOLN
0.2000 mg | INTRAMUSCULAR | Status: DC | PRN
  Administered 2023-12-02: 0.4 mg via INTRAVENOUS
  Administered 2023-12-02: 0.2 mg via INTRAVENOUS
  Filled 2023-12-02 (×2): qty 1

## 2023-12-02 MED ORDER — KETOROLAC TROMETHAMINE 30 MG/ML IJ SOLN
30.0000 mg | Freq: Four times a day (QID) | INTRAMUSCULAR | Status: DC
  Administered 2023-12-02: 30 mg via INTRAVENOUS
  Filled 2023-12-02: qty 1

## 2023-12-02 MED ORDER — HYDROMORPHONE HCL 1 MG/ML IJ SOLN
INTRAMUSCULAR | Status: DC | PRN
  Administered 2023-12-02: .5 mg via INTRAVENOUS

## 2023-12-02 MED ORDER — SODIUM CHLORIDE (PF) 0.9 % IJ SOLN
INTRAMUSCULAR | Status: AC
Start: 2023-12-02 — End: ?
  Filled 2023-12-02: qty 50

## 2023-12-02 MED ORDER — FENTANYL CITRATE (PF) 100 MCG/2ML IJ SOLN
INTRAMUSCULAR | Status: DC | PRN
Start: 2023-12-02 — End: 2023-12-02
  Administered 2023-12-02 (×2): 100 ug via INTRAVENOUS
  Administered 2023-12-02: 50 ug via INTRAVENOUS
  Administered 2023-12-02: 100 ug via INTRAVENOUS
  Administered 2023-12-02: 50 ug via INTRAVENOUS

## 2023-12-02 MED ORDER — ONDANSETRON HCL 4 MG PO TABS: 4.0000 mg | ORAL_TABLET | Freq: Four times a day (QID) | ORAL | Status: DC | PRN

## 2023-12-02 MED ORDER — FENTANYL CITRATE (PF) 100 MCG/2ML IJ SOLN
25.0000 ug | INTRAMUSCULAR | Status: DC | PRN
  Administered 2023-12-02 (×3): 50 ug via INTRAVENOUS

## 2023-12-02 MED ORDER — FENTANYL CITRATE (PF) 250 MCG/5ML IJ SOLN
INTRAMUSCULAR | Status: AC
  Filled 2023-12-02: qty 5

## 2023-12-02 SURGICAL SUPPLY — 56 items
BARRIER ADHS 3X4 INTERCEED (GAUZE/BANDAGES/DRESSINGS) IMPLANT
COVER BACK TABLE 60X90IN (DRAPES) ×3 IMPLANT
COVER MAYO STAND STRL (DRAPES) ×3 IMPLANT
COVER TIP SHEARS 8 DVNC (MISCELLANEOUS) ×3 IMPLANT
DEFOGGER SCOPE WARM SEASHARP (MISCELLANEOUS) ×3 IMPLANT
DERMABOND ADVANCED .7 DNX12 (GAUZE/BANDAGES/DRESSINGS) ×3 IMPLANT
DRAPE ARM DVNC X/XI (DISPOSABLE) ×12 IMPLANT
DRAPE COLUMN DVNC XI (DISPOSABLE) ×3 IMPLANT
DRAPE SURG IRRIG POUCH 19X23 (DRAPES) ×6 IMPLANT
DRAPE UTILITY XL STRL (DRAPES) ×3 IMPLANT
DRIVER NDL MEGA SUTCUT DVNCXI (INSTRUMENTS) ×3 IMPLANT
DRIVER NDLE MEGA SUTCUT DVNCXI (INSTRUMENTS) ×3 IMPLANT
DURAPREP 26ML APPLICATOR (WOUND CARE) ×3 IMPLANT
ELECTRODE REM PT RTRN 9FT ADLT (ELECTROSURGICAL) ×3 IMPLANT
FORCEPS BPLR FENES DVNC XI (FORCEP) ×3 IMPLANT
FORCEPS PROGRASP DVNC XI (FORCEP) IMPLANT
FORCEPS TENACULUM DVNC XI (FORCEP) IMPLANT
GAUZE 4X4 16PLY ~~LOC~~+RFID DBL (SPONGE) IMPLANT
GLOVE BIO SURGEON STRL SZ 6 (GLOVE) ×9 IMPLANT
GLOVE BIOGEL PI IND STRL 6.5 (GLOVE) ×9 IMPLANT
HOLDER FOLEY CATH W/STRAP (MISCELLANEOUS) IMPLANT
IRRIGATION SUCT STRKRFLW 2 WTP (MISCELLANEOUS) ×3 IMPLANT
IV NS 1000ML BAXH (IV SOLUTION) IMPLANT
KIT PINK PAD W/HEAD ARE REST (MISCELLANEOUS) ×3 IMPLANT
KIT PINK PAD W/HEAD ARM REST (MISCELLANEOUS) ×3 IMPLANT
KIT TURNOVER KIT B (KITS) ×3 IMPLANT
LEGGING LITHOTOMY PAIR STRL (DRAPES) ×3 IMPLANT
LIGASURE VESSEL 5MM BLUNT TIP (ELECTROSURGICAL) IMPLANT
MANIPULATOR ADVINCU DEL 2.5 PL (MISCELLANEOUS) IMPLANT
MANIPULATOR ADVINCU DEL 3.0 PL (MISCELLANEOUS) IMPLANT
MANIPULATOR ADVINCU DEL 3.5 PL (MISCELLANEOUS) IMPLANT
MANIPULATOR ADVINCU DEL 4.0 PL (MISCELLANEOUS) IMPLANT
NDL INSUFFLATION 14GA 120MM (NEEDLE) ×3 IMPLANT
NEEDLE INSUFFLATION 14GA 120MM (NEEDLE) ×3 IMPLANT
NS IRRIG 1000ML POUR BTL (IV SOLUTION) IMPLANT
OBTURATOR OPTICALSTD 8 DVNC (TROCAR) ×3 IMPLANT
PACK ROBOT WH (CUSTOM PROCEDURE TRAY) ×3 IMPLANT
PACK ROBOTIC GOWN (GOWN DISPOSABLE) ×3 IMPLANT
PAD OB MATERNITY 11 LF (PERSONAL CARE ITEMS) ×3 IMPLANT
PAD PREP 24X48 CUFFED NSTRL (MISCELLANEOUS) ×3 IMPLANT
POUCH LAPAROSCOPIC INSTRUMENT (MISCELLANEOUS) IMPLANT
SCISSORS MNPLR CVD DVNC XI (INSTRUMENTS) ×3 IMPLANT
SEAL UNIV 5-12 XI (MISCELLANEOUS) ×9 IMPLANT
SET IRRIG Y TYPE TUR BLADDER L (SET/KITS/TRAYS/PACK) ×3 IMPLANT
SET TRI-LUMEN FLTR TB AIRSEAL (TUBING) ×3 IMPLANT
SLEEVE SCD COMPRESS KNEE MED (STOCKING) ×3 IMPLANT
SPIKE FLUID TRANSFER (MISCELLANEOUS) ×3 IMPLANT
SUT VIC AB 0 CT1 36 (SUTURE) ×3 IMPLANT
SUT VIC AB 3-0 CT1 36 (SUTURE) IMPLANT
SUT VICRYL RAPIDE 3 0 (SUTURE) ×6 IMPLANT
SUT VLOC 180 0 9IN GS21 (SUTURE) ×3 IMPLANT
TOWEL OR 17X24 6PK STRL BLUE (TOWEL DISPOSABLE) ×3 IMPLANT
TRAY FOLEY W/BAG SLVR 14FR LF (SET/KITS/TRAYS/PACK) ×3 IMPLANT
TROCAR PORT AIRSEAL 5X120 (TROCAR) ×3 IMPLANT
TROCAR Z-THREAD FIOS 5X100MM (TROCAR) IMPLANT
WATER STERILE IRR 1000ML POUR (IV SOLUTION) ×3 IMPLANT

## 2023-12-02 NOTE — Brief Op Note (Signed)
 12/02/2023  9:15 AM  PATIENT:  Jamie Carey  33 y.o. female  PRE-OPERATIVE DIAGNOSIS:  Abnormal Uterine Bleeding  POST-OPERATIVE DIAGNOSIS:  Abnormal Uterine Bleeding  PROCEDURE:  Procedure(s): XI ROBOTIC ASSISTED LAPAROSCOPIC HYSTERECTOMY AND LEFT  SALPINGECTOMY (Left) CYSTOSCOPY SUTURE REPAIR, LACERATION, PERINEUM  SURGEON:  Surgeons and Role:    * Theodoro Fisherman, MD - Primary    * Alene Ana, MD - Assisting   ANESTHESIA:   local and general  EBL:  50 mL   BLOOD ADMINISTERED:none  DRAINS: Urinary Catheter (Foley)   LOCAL MEDICATIONS USED:  MARCAINE   , Amount: 20 ml, and OTHER Ropivicaine  SPECIMEN:  Source of Specimen:  uterus, cervix, left fallopian tube  DISPOSITION OF SPECIMEN:  PATHOLOGY  COUNTS:  YES  TOURNIQUET:  * No tourniquets in log *  DICTATION: .Note written in EPIC  PLAN OF CARE: Discharge to home after PACU  PATIENT DISPOSITION:  PACU - hemodynamically stable.   Delay start of Pharmacological VTE agent (>24hrs) due to surgical blood loss or risk of bleeding: not applicable

## 2023-12-02 NOTE — Transfer of Care (Signed)
 Immediate Anesthesia Transfer of Care Note  Patient: Jamie Carey  Procedure(s) Performed: XI ROBOTIC ASSISTED LAPAROSCOPIC HYSTERECTOMY AND LEFT  SALPINGECTOMY (Left: Pelvis) CYSTOSCOPY (Bladder) SUTURE REPAIR, LACERATION, PERINEUM (Perineum)  Patient Location: PACU  Anesthesia Type:General  Level of Consciousness: awake, alert , oriented, and patient cooperative  Airway & Oxygen Therapy: Patient Spontanous Breathing  Post-op Assessment: Report given to RN and Post -op Vital signs reviewed and stable  Post vital signs: Reviewed and stable  Last Vitals:  Vitals Value Taken Time  BP 120/76 12/02/23 0930  Temp    Pulse 94 12/02/23 0932  Resp 17 12/02/23 0932  SpO2 96 % 12/02/23 0932  Vitals shown include unfiled device data.  Last Pain:  Vitals:   12/02/23 0552  TempSrc: Oral      Patients Stated Pain Goal: (P) 6 (12/02/23 0645)  Complications: No notable events documented.

## 2023-12-02 NOTE — Anesthesia Postprocedure Evaluation (Signed)
 Anesthesia Post Note  Patient: Jamie Carey  Procedure(s) Performed: XI ROBOTIC ASSISTED LAPAROSCOPIC HYSTERECTOMY AND LEFT  SALPINGECTOMY (Left: Pelvis) CYSTOSCOPY (Bladder) SUTURE REPAIR, LACERATION, PERINEUM (Perineum)     Patient location during evaluation: PACU Anesthesia Type: General Level of consciousness: awake Pain management: pain level controlled Vital Signs Assessment: post-procedure vital signs reviewed and stable Respiratory status: spontaneous breathing, nonlabored ventilation and respiratory function stable Cardiovascular status: blood pressure returned to baseline and stable Postop Assessment: no apparent nausea or vomiting Anesthetic complications: no   No notable events documented.  Last Vitals:  Vitals:   12/02/23 1248 12/02/23 1651  BP: 124/80 120/75  Pulse: 83 88  Resp: 17 16  Temp:  36.7 C  SpO2: 96% 99%    Last Pain:  Vitals:   12/02/23 1651  TempSrc: Oral  PainSc: 7    Pain Goal: Patients Stated Pain Goal: 4 (12/02/23 1123)                 [name]

## 2023-12-02 NOTE — Op Note (Signed)
 12/02/2023   9:15 AM   PATIENT:  Jamie Carey  33 y.o. female   PRE-OPERATIVE DIAGNOSIS:  Abnormal Uterine Bleeding   POST-OPERATIVE DIAGNOSIS:  Abnormal Uterine Bleeding   PROCEDURE:  Procedure(s): XI ROBOTIC ASSISTED LAPAROSCOPIC HYSTERECTOMY AND LEFT  SALPINGECTOMY (Left) CYSTOSCOPY SUTURE REPAIR, LACERATION, PERINEUM   SURGEON:  Surgeons and Role:    * Theodoro Fisherman, MD - Primary    * Alene Ana, MD - Assisting     ANESTHESIA:   local and general   EBL:  50 mL    BLOOD ADMINISTERED:none   DRAINS: Urinary Catheter (Foley)    LOCAL MEDICATIONS USED:  MARCAINE   , Amount: 20 ml, and OTHER Ropivicaine   SPECIMEN:  Source of Specimen:  uterus, cervix, left fallopian tube   DISPOSITION OF SPECIMEN:  PATHOLOGY   COUNTS:  YES    PLAN OF CARE: Discharge to home after PACU   PATIENT DISPOSITION:  PACU - hemodynamically stable.  FINDINGS:  On bimanual exam, globular uterus sounded to 9cm with IUD strings seen. On laparoscopic view, globular appearing uterus. Right fallopian tube and ovary surgerically absent. Normal appearing left fallopian tube and ovary. Filmy omental adhesions to anterior abdominal wall. Grossly normal appearing upper abdomen and ureters.   Cystoscopy demonstrated intact bladder and bilateral ureteral jets.    DESCRIPTION OF PROCEDURE: After consent was verified the patient was taken to the operating room.  After adequate anesthesia was achieved the patient was positioned in the dorsal lithotomy position with legs in stirrups. SCDs were in place and cycling.  Exam revealed a 9 week size globular uterus. The patient was prepped and draped in normal sterile fashion. Ancef 2g was given for infection prophylaxis. A speculum was placed in the vagina and the anterior lip of the cervix was grasped with a tenaculum. IUD strings were seen. A stay suture was placed on the anterior lip of the cervix. The uterus was sounded to 9cm and the cervix  dilated with [facility] dilators.  The 2.5 cm delineating ring was assembled and placed in the proper fashion.  The speculum was removed and the bladder was drained with a Foley catheter.   Attention was turned to the abdomen. An 8mm incision was made 1 cm above the umbilicus and the Veress needle was inserted through the incision. Correct placement was confirmed by opening pressure of . The abdomen was insufflated with CO2 gas to a total pressure of . The 8 mm robotic trochar was inserted into the abdominal cavity. The laparoscope was inserted and confirmed no vascular or visceral trauma from entry. The patient was placed in steep Trendelenburg.  Two additional 8mm robotic trochars were placed, one on each side of the abdomen. An 5mm Airseal assistant port was placed on the left side. All trochars were inserted under direct visualization of the camera.  The robot was docked.  The fenestrated bipolar was placed on arm 1 and the monopolar scissors on arm 3 and introduced under direct visualization of the camera.   Survey of the abdomen revealed findings as noted above. Bilateral  ureters were visualized peristalsing. The left mesosalpinx was serially ligated to free the left fallopian tube. The left round ligament was transected.  The vesicouterine peritoneum was opened and the bladder was dissected off the lower uterine segment and upper vagina.  The same was repeated on the right side. The left posterior broad ligament was skeletonized. The same process was repeated on the right side. The left uterine  vessels were dessicated with bipolar cautery and transected with monopolar scissors. The right uterine vessels were transected in the same fashion. The cervicovaginal junction was identified with the delineating ring and the monopolar scissors were used to enter the vagina and incise circumferentially around the cervicovaginal junction. The uterus, cervix, and left fallopian tubes were removed through the  vagina and sent for pathology. The vaginal cuff was closed with V loc suture in a running fashion. The suture was removed. An assistant palpated the vaginal cuff from the vaginal field and confirmed the vaginal cuff was intact without defect. The abdomen was irrigated and suctioned. The robot was undocked. Attention was turned to the omental adhesions, which were ligated with the ligasure. Arista was applied to the omental ligation site. The pelvis and omentum were observed to be hemostatic at and . The robot was undocked and Ropivicaine was introduced into the pelvis. All ports were removed. Fluorescein was administered.    The port sites were closed with 3-0 vicryl in a subcuticular fashion and covered with skin glue.  The foley catheter was removed. A cystoscope was inserted into the bladder and the bladder was distended with saline. Survey revealed intact bladder and bilateral ureteral jets were appreciated. The bladder was drained and foley reintroduced. A small hymenal laceration was noted and repaired with 3-0 vicryl in a single figure of 8 stitched.  A manual exam again confirmed the vaginal cuff was intact without defect.    The patient was awakened from anesthesia and taken to the recovery room in stable condition.  Zenia Hight, MD 12/02/23 10:02 AM

## 2023-12-02 NOTE — Anesthesia Procedure Notes (Signed)
 Procedure Name: Intubation Date/Time: 12/02/2023 7:35 AM  Performed by: Ezzie Holstein, CRNAPre-anesthesia Checklist: Patient identified, Emergency Drugs available, Suction available and Patient being monitored Patient Re-evaluated:Patient Re-evaluated prior to induction Oxygen Delivery Method: Circle System Utilized Preoxygenation: Pre-oxygenation with 100% oxygen Induction Type: IV induction Ventilation: Mask ventilation without difficulty Laryngoscope Size: Mac and 3 Grade View: Grade I Tube type: Oral Tube size: 7.0 mm Number of attempts: 1 Airway Equipment and Method: Stylet Placement Confirmation: ETT inserted through vocal cords under direct vision, positive ETCO2 and breath sounds checked- equal and bilateral Secured at: 22 cm Tube secured with: Tape Dental Injury: Teeth and Oropharynx as per pre-operative assessment

## 2023-12-02 NOTE — Interval H&P Note (Signed)
 History and Physical Interval Note:  12/02/2023 7:17 AM  Jamie Carey  has presented today for surgery, with the diagnosis of Abnormal Uterine Bleeding.  The various methods of treatment have been discussed with the patient and family. After consideration of risks, benefits and other options for treatment, the patient has consented to  ROBOT ASSISTED TOTAL LAPAROSCOPIC HYSTERECTOMY, UNILATERAL SALPINGECTOMY, CYSTOSCOPY as a surgical intervention.  The patient's history has been reviewed, patient examined, no change in status, stable for surgery.  I have reviewed the patient's chart and labs.  Questions were answered to the patient's satisfaction.     Theodoro Fisherman

## 2023-12-02 NOTE — Progress Notes (Signed)
   12/02/23 1940  Departure Condition  Departure Condition Good  Mobility at [facility]  Patient/Caregiver Teaching Teach Back Method Used;Discharge instructions reviewed;Prescriptions reviewed;Follow-up care reviewed;Pain management discussed;Medications discussed;Patient/caregiver verbalized understanding  Departure Mode With significant other   Pt A&O x4, vital signs stable, pain under control.

## 2023-12-02 NOTE — Anesthesia Preprocedure Evaluation (Addendum)
 Anesthesia Evaluation  Patient identified by MRN, date of birth, ID band Patient awake    Reviewed: Allergy & Precautions, NPO status , Patient's Chart, lab work & pertinent test results  Airway Mallampati: II  TM Distance: >3 FB Neck ROM: Full    Dental no notable dental hx.    Pulmonary neg pulmonary ROS   Pulmonary exam normal        Cardiovascular negative cardio ROS Normal cardiovascular exam     Neuro/Psych  Headaches  negative psych ROS   GI/Hepatic negative GI ROS, Neg liver ROS,,,  Endo/Other  negative endocrine ROS    Renal/GU negative Renal ROS     Musculoskeletal negative musculoskeletal ROS (+)    Abdominal  (+) + obese  Peds  Hematology negative hematology ROS (+)   Anesthesia Other Findings Abnormal Uterine Bleeding  Reproductive/Obstetrics Hcg negative                              Anesthesia Physical Anesthesia Plan  ASA: 2  Anesthesia Plan: General   Post-op Pain Management:    Induction: Intravenous  PONV Risk Score and Plan: Scopolamine patch - Pre-op, Ondansetron , Dexamethasone, Midazolam and Treatment may vary due to age or medical condition  Airway Management Planned: Oral ETT  Additional Equipment:   Intra-op Plan:   Post-operative Plan: Extubation in OR  Informed Consent: I have reviewed the patients History and Physical, chart, labs and discussed the procedure including the risks, benefits and alternatives for the proposed anesthesia with the patient or authorized representative who has indicated his/her understanding and acceptance.     Dental advisory given  Plan Discussed with: CRNA  Anesthesia Plan Comments:        Anesthesia Quick Evaluation

## 2023-12-03 ENCOUNTER — Encounter (HOSPITAL_COMMUNITY): Payer: Self-pay | Admitting: Obstetrics and Gynecology

## 2023-12-03 LAB — ABO/RH: ABO/RH(D): A POS

## 2023-12-03 LAB — SURGICAL PATHOLOGY

## 2024-05-16 DIAGNOSIS — Z01419 Encounter for gynecological examination (general) (routine) without abnormal findings: Secondary | ICD-10-CM | POA: Diagnosis not present
